# Patient Record
Sex: Female | Born: 1960 | Race: White | Hispanic: No | Marital: Married | Smoking: Never smoker
Health system: Southern US, Community
[De-identification: ages and names within clinical notes are randomized; demographics above are authoritative.]

## PROBLEM LIST (undated history)

## (undated) DIAGNOSIS — N926 Irregular menstruation, unspecified: Secondary | ICD-10-CM

## (undated) DIAGNOSIS — B379 Candidiasis, unspecified: Secondary | ICD-10-CM

## (undated) DIAGNOSIS — K219 Gastro-esophageal reflux disease without esophagitis: Secondary | ICD-10-CM

## (undated) DIAGNOSIS — Z87898 Personal history of other specified conditions: Secondary | ICD-10-CM

## (undated) DIAGNOSIS — N281 Cyst of kidney, acquired: Secondary | ICD-10-CM

## (undated) DIAGNOSIS — Z8619 Personal history of other infectious and parasitic diseases: Secondary | ICD-10-CM

## (undated) DIAGNOSIS — Z87440 Personal history of urinary (tract) infections: Secondary | ICD-10-CM

## (undated) DIAGNOSIS — Z8742 Personal history of other diseases of the female genital tract: Secondary | ICD-10-CM

## (undated) DIAGNOSIS — E282 Polycystic ovarian syndrome: Secondary | ICD-10-CM

## (undated) DIAGNOSIS — O09529 Supervision of elderly multigravida, unspecified trimester: Secondary | ICD-10-CM

## (undated) DIAGNOSIS — M1990 Unspecified osteoarthritis, unspecified site: Secondary | ICD-10-CM

## (undated) DIAGNOSIS — F439 Reaction to severe stress, unspecified: Secondary | ICD-10-CM

## (undated) DIAGNOSIS — F4321 Adjustment disorder with depressed mood: Secondary | ICD-10-CM

## (undated) DIAGNOSIS — N970 Female infertility associated with anovulation: Secondary | ICD-10-CM

## (undated) HISTORY — PX: BLADDER SURGERY: SHX569

## (undated) HISTORY — DX: Personal history of other specified conditions: Z87.898

## (undated) HISTORY — DX: Polycystic ovarian syndrome: E28.2

## (undated) HISTORY — DX: Personal history of other infectious and parasitic diseases: Z86.19

## (undated) HISTORY — DX: Adjustment disorder with depressed mood: F43.21

## (undated) HISTORY — PX: UMBILICAL HERNIA REPAIR: SHX196

## (undated) HISTORY — DX: Reaction to severe stress, unspecified: F43.9

## (undated) HISTORY — DX: Candidiasis, unspecified: B37.9

## (undated) HISTORY — PX: KIDNEY STONE SURGERY: SHX686

## (undated) HISTORY — DX: Unspecified osteoarthritis, unspecified site: M19.90

## (undated) HISTORY — DX: Irregular menstruation, unspecified: N92.6

## (undated) HISTORY — DX: Cyst of kidney, acquired: N28.1

## (undated) HISTORY — DX: Supervision of elderly multigravida, unspecified trimester: O09.529

## (undated) HISTORY — PX: KNEE SURGERY: SHX244

## (undated) HISTORY — DX: Personal history of urinary (tract) infections: Z87.440

## (undated) HISTORY — DX: Personal history of other diseases of the female genital tract: Z87.42

## (undated) HISTORY — PX: ADENOIDECTOMY: SUR15

## (undated) HISTORY — PX: OTHER SURGICAL HISTORY: SHX169

## (undated) HISTORY — DX: Gastro-esophageal reflux disease without esophagitis: K21.9

## (undated) HISTORY — DX: Female infertility associated with anovulation: N97.0

## (undated) HISTORY — PX: TONSILLECTOMY: SUR1361

## (undated) HISTORY — PX: DILATION AND CURETTAGE OF UTERUS: SHX78

---

## 1997-03-24 ENCOUNTER — Inpatient Hospital Stay (HOSPITAL_COMMUNITY): Admission: AD | Admit: 1997-03-24 | Discharge: 1997-03-24 | Payer: Self-pay | Admitting: *Deleted

## 1997-08-03 ENCOUNTER — Ambulatory Visit (HOSPITAL_COMMUNITY): Admission: RE | Admit: 1997-08-03 | Discharge: 1997-08-03 | Payer: Self-pay | Admitting: Obstetrics and Gynecology

## 1997-08-08 ENCOUNTER — Encounter: Admission: RE | Admit: 1997-08-08 | Discharge: 1997-11-06 | Payer: Self-pay | Admitting: Obstetrics and Gynecology

## 1997-10-24 ENCOUNTER — Inpatient Hospital Stay (HOSPITAL_COMMUNITY): Admission: AD | Admit: 1997-10-24 | Discharge: 1997-10-26 | Payer: Self-pay | Admitting: Obstetrics and Gynecology

## 1997-12-11 ENCOUNTER — Other Ambulatory Visit: Admission: RE | Admit: 1997-12-11 | Discharge: 1997-12-11 | Payer: Self-pay | Admitting: Obstetrics and Gynecology

## 2000-03-02 ENCOUNTER — Other Ambulatory Visit: Admission: RE | Admit: 2000-03-02 | Discharge: 2000-03-02 | Payer: Self-pay | Admitting: Obstetrics and Gynecology

## 2000-06-02 ENCOUNTER — Ambulatory Visit (HOSPITAL_COMMUNITY): Admission: RE | Admit: 2000-06-02 | Discharge: 2000-06-02 | Payer: Self-pay | Admitting: Obstetrics and Gynecology

## 2000-06-02 ENCOUNTER — Encounter: Admission: RE | Admit: 2000-06-02 | Discharge: 2000-06-02 | Payer: Self-pay | Admitting: Obstetrics and Gynecology

## 2000-06-02 ENCOUNTER — Encounter: Payer: Self-pay | Admitting: Obstetrics and Gynecology

## 2000-10-27 ENCOUNTER — Encounter: Payer: Self-pay | Admitting: Urology

## 2000-10-27 ENCOUNTER — Encounter: Admission: RE | Admit: 2000-10-27 | Discharge: 2000-10-27 | Payer: Self-pay | Admitting: Urology

## 2000-11-05 ENCOUNTER — Ambulatory Visit (HOSPITAL_COMMUNITY): Admission: RE | Admit: 2000-11-05 | Discharge: 2000-11-05 | Payer: Self-pay | Admitting: Urology

## 2000-11-05 ENCOUNTER — Encounter: Payer: Self-pay | Admitting: Urology

## 2000-11-19 ENCOUNTER — Encounter: Admission: RE | Admit: 2000-11-19 | Discharge: 2000-11-19 | Payer: Self-pay | Admitting: Urology

## 2000-11-19 ENCOUNTER — Encounter: Payer: Self-pay | Admitting: Urology

## 2001-05-19 ENCOUNTER — Other Ambulatory Visit: Admission: RE | Admit: 2001-05-19 | Discharge: 2001-05-19 | Payer: Self-pay | Admitting: Obstetrics and Gynecology

## 2001-07-13 ENCOUNTER — Encounter: Payer: Self-pay | Admitting: Obstetrics and Gynecology

## 2001-07-13 ENCOUNTER — Encounter: Admission: RE | Admit: 2001-07-13 | Discharge: 2001-07-13 | Payer: Self-pay | Admitting: Obstetrics and Gynecology

## 2002-06-15 ENCOUNTER — Other Ambulatory Visit: Admission: RE | Admit: 2002-06-15 | Discharge: 2002-06-15 | Payer: Self-pay | Admitting: Obstetrics and Gynecology

## 2002-12-26 ENCOUNTER — Encounter: Admission: RE | Admit: 2002-12-26 | Discharge: 2002-12-26 | Payer: Self-pay | Admitting: Obstetrics and Gynecology

## 2003-12-11 ENCOUNTER — Other Ambulatory Visit: Admission: RE | Admit: 2003-12-11 | Discharge: 2003-12-11 | Payer: Self-pay | Admitting: Obstetrics and Gynecology

## 2004-01-03 ENCOUNTER — Encounter: Admission: RE | Admit: 2004-01-03 | Discharge: 2004-01-03 | Payer: Self-pay | Admitting: Obstetrics and Gynecology

## 2004-02-19 ENCOUNTER — Ambulatory Visit (HOSPITAL_COMMUNITY): Admission: RE | Admit: 2004-02-19 | Discharge: 2004-02-19 | Payer: Self-pay | Admitting: Gastroenterology

## 2004-10-08 ENCOUNTER — Inpatient Hospital Stay (HOSPITAL_COMMUNITY): Admission: AD | Admit: 2004-10-08 | Discharge: 2004-10-09 | Payer: Self-pay | Admitting: Obstetrics and Gynecology

## 2004-10-09 ENCOUNTER — Encounter (INDEPENDENT_AMBULATORY_CARE_PROVIDER_SITE_OTHER): Payer: Self-pay | Admitting: Specialist

## 2004-10-09 ENCOUNTER — Encounter (INDEPENDENT_AMBULATORY_CARE_PROVIDER_SITE_OTHER): Payer: Self-pay | Admitting: *Deleted

## 2004-11-20 ENCOUNTER — Other Ambulatory Visit: Admission: RE | Admit: 2004-11-20 | Discharge: 2004-11-20 | Payer: Self-pay | Admitting: Obstetrics and Gynecology

## 2004-11-26 ENCOUNTER — Ambulatory Visit: Payer: Self-pay | Admitting: Hematology and Oncology

## 2005-01-06 DIAGNOSIS — Z87898 Personal history of other specified conditions: Secondary | ICD-10-CM

## 2005-01-06 DIAGNOSIS — F4320 Adjustment disorder, unspecified: Secondary | ICD-10-CM

## 2005-01-06 DIAGNOSIS — F439 Reaction to severe stress, unspecified: Secondary | ICD-10-CM

## 2005-01-06 DIAGNOSIS — N926 Irregular menstruation, unspecified: Secondary | ICD-10-CM

## 2005-01-06 DIAGNOSIS — F4321 Adjustment disorder with depressed mood: Secondary | ICD-10-CM

## 2005-01-06 DIAGNOSIS — N970 Female infertility associated with anovulation: Secondary | ICD-10-CM

## 2005-01-06 HISTORY — DX: Personal history of other specified conditions: Z87.898

## 2005-01-06 HISTORY — PX: OTHER SURGICAL HISTORY: SHX169

## 2005-01-06 HISTORY — DX: Irregular menstruation, unspecified: N92.6

## 2005-01-06 HISTORY — DX: Female infertility associated with anovulation: N97.0

## 2005-01-06 HISTORY — DX: Reaction to severe stress, unspecified: F43.9

## 2005-01-06 HISTORY — DX: Adjustment disorder, unspecified: F43.20

## 2005-01-06 HISTORY — DX: Adjustment disorder with depressed mood: F43.21

## 2005-03-10 ENCOUNTER — Encounter: Admission: RE | Admit: 2005-03-10 | Discharge: 2005-03-10 | Payer: Self-pay | Admitting: Obstetrics and Gynecology

## 2006-01-06 DIAGNOSIS — Z87440 Personal history of urinary (tract) infections: Secondary | ICD-10-CM

## 2006-01-06 HISTORY — DX: Personal history of urinary (tract) infections: Z87.440

## 2006-06-26 ENCOUNTER — Ambulatory Visit: Payer: Self-pay | Admitting: Pulmonary Disease

## 2006-06-30 ENCOUNTER — Encounter: Admission: RE | Admit: 2006-06-30 | Discharge: 2006-06-30 | Payer: Self-pay | Admitting: Obstetrics and Gynecology

## 2006-07-03 ENCOUNTER — Inpatient Hospital Stay (HOSPITAL_COMMUNITY): Admission: AD | Admit: 2006-07-03 | Discharge: 2006-07-03 | Payer: Self-pay | Admitting: Obstetrics and Gynecology

## 2006-07-06 ENCOUNTER — Ambulatory Visit: Payer: Self-pay | Admitting: Pulmonary Disease

## 2006-07-06 ENCOUNTER — Ambulatory Visit (HOSPITAL_BASED_OUTPATIENT_CLINIC_OR_DEPARTMENT_OTHER): Admission: RE | Admit: 2006-07-06 | Discharge: 2006-07-06 | Payer: Self-pay | Admitting: Pulmonary Disease

## 2006-09-08 ENCOUNTER — Inpatient Hospital Stay (HOSPITAL_COMMUNITY): Admission: AD | Admit: 2006-09-08 | Discharge: 2006-09-08 | Payer: Self-pay | Admitting: Obstetrics and Gynecology

## 2006-10-13 ENCOUNTER — Inpatient Hospital Stay (HOSPITAL_COMMUNITY): Admission: RE | Admit: 2006-10-13 | Discharge: 2006-10-13 | Payer: Self-pay | Admitting: Obstetrics and Gynecology

## 2006-10-26 ENCOUNTER — Inpatient Hospital Stay (HOSPITAL_COMMUNITY): Admission: AD | Admit: 2006-10-26 | Discharge: 2006-10-28 | Payer: Self-pay | Admitting: Obstetrics and Gynecology

## 2008-01-07 HISTORY — PX: OTHER SURGICAL HISTORY: SHX169

## 2008-10-02 ENCOUNTER — Encounter (INDEPENDENT_AMBULATORY_CARE_PROVIDER_SITE_OTHER): Payer: Self-pay | Admitting: Obstetrics and Gynecology

## 2008-10-02 ENCOUNTER — Ambulatory Visit (HOSPITAL_COMMUNITY): Admission: RE | Admit: 2008-10-02 | Discharge: 2008-10-02 | Payer: Self-pay | Admitting: Obstetrics and Gynecology

## 2008-10-02 DIAGNOSIS — Z8742 Personal history of other diseases of the female genital tract: Secondary | ICD-10-CM

## 2008-10-02 HISTORY — PX: HYSTEROSCOPY WITH D & C: SHX1775

## 2008-10-02 HISTORY — DX: Personal history of other diseases of the female genital tract: Z87.42

## 2009-01-06 DIAGNOSIS — Z87898 Personal history of other specified conditions: Secondary | ICD-10-CM

## 2009-01-06 HISTORY — DX: Personal history of other specified conditions: Z87.898

## 2010-04-12 LAB — BASIC METABOLIC PANEL
BUN: 13 mg/dL (ref 6–23)
CO2: 25 mEq/L (ref 19–32)
Calcium: 9 mg/dL (ref 8.4–10.5)
Chloride: 107 mEq/L (ref 96–112)
Creatinine, Ser: 0.58 mg/dL (ref 0.4–1.2)
GFR calc Af Amer: >60 mL/min (ref >60)
GFR calc non Af Amer: >60 mL/min (ref >60)
Glucose, Bld: 91 mg/dL (ref 70–99)
Potassium: 3.6 mEq/L (ref 3.5–5.1)
Sodium: 139 mEq/L (ref 135–145)

## 2010-04-12 LAB — GLUCOSE, CAPILLARY: Glucose-Capillary: 91 mg/dL (ref 70–99)

## 2010-04-12 LAB — CBC: Hemoglobin: 11.5 g/dL — ABNORMAL LOW (ref 12.0–15.0)

## 2010-04-12 LAB — PREGNANCY, URINE: Preg Test, Ur: NEGATIVE

## 2010-05-21 ENCOUNTER — Other Ambulatory Visit: Payer: Self-pay | Admitting: Obstetrics and Gynecology

## 2010-05-21 DIAGNOSIS — Z1231 Encounter for screening mammogram for malignant neoplasm of breast: Secondary | ICD-10-CM

## 2010-05-21 NOTE — Op Note (Signed)
NAME:  Laurie Garcia, Laurie Garcia                   ACCOUNT NO.:  [id number]   MEDICAL RECORD NO.:  [id number]          PATIENT TYPE:  MAT   LOCATION:  MATC                          FAC[facility]:  [facility]   PHYSICIAN:  Janine Limbo, M.D.DATE OF BIRTH:  [birth year]   DATE OF PROCEDURE:  10/13/2006  DATE OF DISCHARGE:                               OPERATIVE REPORT   PREOPERATIVE DIAGNOSIS:  1. 37 weeks' gestation.  2. Gestational diabetes requiring insulin.  3. History of a second trimester intrauterine fetal demise.   POSTOPERATIVE DIAGNOSIS:  1. 37 weeks' gestation.  2. Gestational diabetes requiring insulin.  3. History of a second trimester intrauterine fetal demise.   PROCEDURE:  Amniocentesis for maturity studies.   SURGEON:  Janine Limbo, M.D.   FIRST ASSISTANT:  None.   ANESTHESIA:  Local Xylocaine.   DISPOSITION:  Laurie Garcia is a 50 year old female, gravida 5, para 3-1-0-3,  who presents at [redacted] weeks gestation (EDC is November 04, 2006).  The  patient has been followed at the [facility]N division of  [facility].  The patient understands the indications  for her amniocentesis and potential induction.  She accepts the risks  of, but not limited to, anesthetic complications, bleeding, infection,  possible rupture of membranes, possible fetal stress, and possible  puncture of the infant.   FINDINGS:  The patient's blood type is O+.  An ultrasound was performed  and a single intrauterine gestation was noted with a cephalic  presentation.  The placenta was to the right of the uterus.  Amniotic  fluid volume was normal.  A total of 18 mL of amniotic fluid was  removed.   OPERATIVE PROCEDURE:  The patient was seen in the ultrasound suite.  An  ultrasound was performed and an appropriate fluid pocket was isolated.  The patient's abdomen was prepped with multiple layers of Betadine and  then sterilely draped.  2 mL of 1% Xylocaine were injected into the  skin.  A 22 gauge spinal needle was placed in the amniotic cavity, but  no fluid could be obtained.  We then used a 20 gauge spinal needle under  direct ultrasound guidance and 18 mL of clear fluid were removed.  The  patient tolerated the procedure well.  There was a stable heart  rate prior to the procedure and a stable heart rate after the procedure.  The patient was sent to the maternity admissions area for nonstress  test.  The fluid was sent to [facility] for testing for  maturity studies.      Janine Limbo, M.D.  Electronically Signed     AVS/MEDQ  D:  10/13/2006  T:  10/13/2006  Job:  [id number]

## 2010-05-21 NOTE — Assessment & Plan Note (Signed)
[facility]                             PULMONARY OFFICE NOTE   NAME:Laurie Garcia, Laurie Garcia                          MRN:          [id number]  DATE:06/26/2006                            DOB:          25-May-1960    I met Laurie Garcia today for evaluation of her sleep difficulties.   She is currently [redacted] weeks pregnant.  She actually had been pregnant last  year, and she had gotten to about 23 weeks in that pregnancy, when she  actually had still birth in October of 2007.  She says that she has  always had difficulties sleeping.  Usually, she will have difficulty  falling asleep initially, and then she will wake up about every 2-3  hours to have to go to the bathroom, after which sometimes she will have  difficulty falling back to sleep.  Although it appears she does get  fairly sleepy during the day, she apparently will fall asleep fairly  easily if she is sitting idle, such as when she is watching TV.  Current  sleep patterns is that she goes to bed between 11 and midnight.  It can  take her an hour to fall asleep.  She will then wake up after about 2-3  hours to have to use the bathroom, and this cycle continues through the  night until she gets up at about 6:30 in the morning.  When she wakes up  in the morning she still feels quite tired.  She has been told that she  snores quite loudly at night, and she has also woken up with a choking  sensation, particularly when she is on her back.  She gets a dry mouth  at night and will sometimes wake up feeling like her teeth are clenched.  She gets very vivid dreams.  There is no history of sleep walking or  sleep talking.  She denies any symptoms of sleep hallucinations, sleep  paralysis or cataplexy.  She had tried taking Benadryl previously prior  to her pregnancy to help with her sleep, and she has also been on Ambien  and Ambien CR to help with her sleep after the loss from her previous  pregnancy, although she is  not currently using these medicines.  She  does complain of an aching feeling in the back of her legs, more so in  her calves, at night.  She finds that she has to flex her legs in order  to relieve these symptoms.  She has also noticed this happening during  her previous pregnancies, particularly when she was nursing.  Her  current pregnancy also has been complicated by gestational diabetes.   PAST MEDICAL HISTORY:  1. Reflux disease.  2. Seasonal allergies.  3. Polycystic ovarian disease.  4. Renal cyst.  5. Nephrolithiasis.  6. Tonsillectomy and adenoidectomy while in college.   CURRENT MEDICATIONS:  Prenatal vitamins.   ALLERGIES:  TYLOX, CODEINE, ULTRAM, and LATEX.   SOCIAL HISTORY:  She is married.  She has 3 children.  She is a  Futures trader.  There is no history  of tobacco or alcohol use.   FAMILY HISTORY:  Significant for father has allergies and asthma and her  son who had problems with snoring.   REVIEW OF SYSTEMS:  Unremarkable except for as stated above.   PHYSICAL EXAMINATION:  She is 5 feet 5 inches tall, 231 pounds,  temperature is 98.6, blood pressure is 110/74, heart rate is 97, oxygen  saturation is 97% on room air.  HEENT:  Pupils reactive.  There is no sinus tenderness, no nasal  discharge.  She has a Mallampati II airway with decreased AP diameter of  the oropharynx.  She has a mild overbite.  There is no lymphadenopathy, no thyromegaly.  Heart was S1, S2, regular rate and rhythm.  CHEST:  There was no wheezing or rales.  ABDOMEN:  She has a gravid uterus.  EXTREMITIES:  There is no edema, cyanosis or clubbing.  NEUROLOGICAL EXAM:  No focal deficits were appreciated.   IMPRESSION:  The main concern that I have is if she has some degree of  sleep disorder breathing.  Given her symptom description, I would be  concerned that she maybe even had this prior to her pregnancy.  With the  pregnancy if she has sleep disorder breathing this could certainly get   worse.  What I will do is arrange for her to have an overnight  polysomnogram to further evaluate this.  I will then follow up with her  in the office to discuss the results of this with her, although she has  requested that if, in fact, she has sleep apnea, she would like to  proceed with therapy for this fairly expeditiously, and, therefore, if  need be, I may initiate her on CPAP therapy prior to being able to  follow up with her in the office.     Coralyn Helling, MD  Electronically Signed    VS/MedQ  DD: 06/26/2006  DT: 06/26/2006  Job #: [id number]   cc:   Janine Limbo, M.D.

## 2010-05-21 NOTE — H&P (Signed)
NAME:  Laurie Garcia, Laurie Garcia                   ACCOUNT NO.:  [id number]   MEDICAL RECORD NO.:  [id number]          PATIENT TYPE:  OUT   LOCATION:  ULT                           [facility]   PHYSICIAN:  Janine Limbo, M.D.DATE OF BIRTH:  05-19-1960   DATE OF ADMISSION:  DATE OF DISCHARGE:                              HISTORY & PHYSICAL   PRIORITY ADMISSION HISTORY AND PHYSICAL   DATE OF ADMISSION:  October 14, 2006.   HISTORY OF PRESENT ILLNESS:  Ms. Hunkele is a 50 year old female, gravida  5, para 3-1-0-3, who presents at [redacted] weeks gestation [facility] November 04, 2006).  The patient has been followed at the [facility]  and Gynecology Division of [facility].  This  pregnancy has been complicated by gestational diabetes that has required  insulin.  The patient is currently taking 28 units of NPH insulin at  bedtime, and 12 units of Regular insulin at dinner.  In addition, the  pregnancy has been complicated by the fact that the patient has had a  second trimester intrauterine fetal demise.  The patient is very anxious  about this pregnancy because of her past history.  The patient's age is  greater than 35, and an amniocentesis was declined.  The patient has a  history of large infants.  The patient has been diagnosed with sleep  apnea.   OBSTETRICAL HISTORY:  The patient had a vaginal delivery in 1987 of a 7  pound, 9 ounce female infant at term.  In 1991, the patient had a vaginal  delivery at [redacted] weeks gestation of a 9 pound, 1 ounce female infant.  In  1999, the patient had a vaginal delivery at [redacted] weeks gestation of an 8  pound, 1 ounce female infant.  In October of 2006, the patient had an  induced delivery of a 1 pound female infant at 77 and 1/[redacted] weeks gestation  with a stillbirth.   DRUG ALLERGIES:  1. LATEX.  2. CODEINE.  3. TYLOX.  4. ULTRAM.   PAST MEDICAL HISTORY:  1. Please see History of Present Illness.  2. The patient was on Metformin briefly  in the past.  3. The patient had her tonsils removed at age 74.  4. The patient had bladder surgery at age 64.  5. She has had surgery on her knees.   SOCIAL HISTORY:  The patient denies cigarette use, alcohol use, and  recreational drug use.   REVIEW OF SYSTEMS:  Normal pregnancy complaints.   FAMILY HISTORY:  Noncontributory.   PHYSICAL EXAMINATION:  VITAL SIGNS:  Weight is 232 pounds.  HEENT:  Within normal limits.  CHEST:  Clear.  HEART:  Regular rate and rhythm.  BREASTS:  Without masses.  ABDOMEN:  Gravid with a fundal height of 38 cm.  EXTREMITIES:  Grossly normal.  NEUROLOGIC:  Exam is grossly normal.   PELVIC  EXAM:  Cervix is 1 to 2 cm dilated, 50% effaced, and minus 3 in  station.   LABORATORY VALUES:  Blood type is O-positive, antibody screen negative,  VDRL  nonreactive, Rubella immune, HBSAG negative, HIV is nonreactive,  third trimester beta Strep is negative, amniocentesis for maturity  studies is pending.   ASSESSMENT:  1. Thirty-seven week gestation.  2. Gestational diabetes requiring insulin.  3. History of intrauterine fetal demise in the second trimester.  4. Extreme anxiety about this pregnancy.  5. Age greater than 35.  6. Obesity (weight 232 pounds).  7. Sleep apnea.  8. History of macrosomia with an ultrasound suggesting that this      infant is in the 90% percentile in growth.   PLAN:  The patient will undergo induction of labor.      Janine Limbo, M.D.  Electronically Signed     AVS/MEDQ  D:  10/08/2006  T:  10/08/2006  Job:  [id number]

## 2010-05-21 NOTE — Procedures (Signed)
NAME:  Laurie Garcia, Laurie Garcia                   ACCOUNT NO.:  [id number]   MEDICAL RECORD NO.:  [id number]          PATIENT TYPE:  OUT   LOCATION:  SLEEP CENTER                 FACILITY:  [facility]   PHYSICIAN:  Coralyn Helling, MD        DATE OF BIRTH:  1960/07/30   DATE OF STUDY:  07/06/2006                            NOCTURNAL POLYSOMNOGRAM   REFERRING PHYSICIAN:  Coralyn Helling, MD   FACILITY:  [facility].   INDICATION:  This is an individual who has a history of gestational  diabetes, sleep disruption and excessive daytime sleepiness.  She is  referred to the sleep lab for evaluation of obstructive sleep apnea.  Epworth score is 3.   MEDICATIONS:  1. Prenatal vitamins.  2. Insulin.   SLEEP ARCHITECTURE:  Total recording time was 355 minutes.  Total sleep  time was 95 minutes.  Sleep efficiency was 27%, which is significantly  reduced.  Sleep latency was 192 minutes, which is significantly  prolonged.  The study was notable for a decrease in the percentage of  slow wave sleep to 4% of the study, and the patient was not observed in  REM sleep.  She slept predominantly in the non-supine position.   RESPIRATORY DATA:  The average respiratory rate was 16.  The overall  apnea-hypopnea index was 7.6.  The events were exclusively obstructive  in nature.  The supine apnea-hypopnea index was zero.  The non-supine  apnea-hypopnea index was 12.  Moderate snoring was noted by the  technician.  It appeared the patient had difficulty with sleep  initiation and sleep maintenance due to respiratory events.   OXYGEN DATA:  The baseline oxygenation was 95%.  The oxygen saturation  nadir was 91%.  The patient spent the entire test with an oxygen  saturation above 91%.   CARDIAC DATA:  Rhythm strip showed normal sinus rhythm.   MOVEMENT AND PARASOMNIA:  The periodic limb movement index was 17.  The  patient had 2 restroom trips.   IMPRESSION:  This study shows evidence for mild obstructive sleep  apnea  as demonstrated by an apnea-hypopnea index of 7.6 with an oxygen  saturation nadir of 91%.  Please note that she did not have very much  supine sleep and she did not have REM sleep, and therefore the severity  of sleep apnea may be underestimated.  She did, however,  have a significantly decreased amount of sleep time, but it appeared  that her difficulty with sleep initiation and sleep maintenance may have  been related to air flow limitation.      Coralyn Helling, MD  Diplomat, American Board of Sleep Medicine  Electronically Signed     VS/MEDQ  D:  07/09/2006 09:00:46  T:  07/09/2006 15:36:26  Job:  [id number]

## 2010-05-24 NOTE — H&P (Signed)
NAME:  Laurie Garcia, Laurie Garcia                   ACCOUNT NO.:  [id number]   MEDICAL RECORD NO.:  [id number]           PATIENT TYPE:   LOCATION:                                 FACILITY:   PHYSICIAN:  Dineen Kid. Rana Snare, M.D.         DATE OF BIRTH:   DATE OF ADMISSION:  10/07/2004  DATE OF DISCHARGE:                                HISTORY & PHYSICAL   HISTORY OF PRESENT ILLNESS:  Laurie Garcia is a 50 year old G4, P3, at 32 weeks'  estimated gestational age by [facility] of February 03, 2005, and presents for  induction of labor due to fetal demise.  She presented to the office on  October 07, 2004 with decreased fetal movement, unable to auscultate fetal  heart tones.  Ultrasound confirmation showed a 23-week breech fetus with no  cardiac activity seen consistent with a fetal demise.  Fetus does have some  degree of edema.  No obvious abnormalities have been seen on ultrasound by  [place] and double 2 ultrasound performed by Dr. Sherrie George.   PAST MEDICAL HISTORY:  1.  Significant for urethral enlargement.  2.  History of renal cyst.  3.  She has a history of gestational diabetes.  4.  History of polycystic ovarian syndrome.  5.  History of kidney stones.   PAST SURGICAL HISTORY:  She has had a tonsillectomy.   MEDICATIONS:  1.  She is on metformin.  2.  Prenatal vitamins.   ALLERGIES:  1.  She has allergy to CODEINE.  2.  TYLOX.  3.  ULTRAM.   PHYSICAL EXAMINATION:  VITAL SIGNS:  Blood pressure is 112/60, weight is 220  pounds.  CERVIX:  Closed, thick, and high.   IMPRESSION/PLAN:  Intrauterine fetal demise, 29 weeks' gestational age.  Dr.  Lurene Garcia is her endocrinologist.  He follows her for polycystic ovarian  syndrome and she is currently on metformin.   PLAN:  Cytotec induction of fetal demise, planned vaginal delivery.  Plan  100 mcg q.12 h. until delivery.  Discussed the route of delivery.  Decreased  increased risk for dilation and curettage for the placenta.  The patient  seems to have a good  understanding of this.  We will also check parva,  toxoplasmosis, CMV titers, ANA, hemoglobin A1c, anti __________function test  as well as genetic evaluation of the fetus.      Dineen Kid Rana Snare, M.D.  Electronically Signed     DCL/MEDQ  D:  10/07/2004  T:  10/07/2004  Job:  [id number]

## 2010-05-24 NOTE — Assessment & Plan Note (Signed)
[facility]                             PULMONARY OFFICE NOTE   NAME:Nickless, Laurie Garcia                          MRN:          [id number]  DATE:07/31/2006                            DOB:          [birth year]    I had received the auto BiPAP download for [name] which was done from  July 10 to July 22.  She had been using the machine on 100% of the  nights.  Her average daily usage was 5 hours 15 minutes.  Her overall  apnea-hypopnea index was 1.7.  Her 90th percentile pressure settings  were 12/9.  I had discussed these results with [name].  She is doing  reasonably well on BiPAP, and I said that it has improved her sleep  quality as well as her energy level during the day.  I have encouraged  her to maintain her compliance with her BiPAP until after her delivery,  and then this can be reassessed after her delivery.     Coralyn Helling, MD  Electronically Signed    VS/MedQ  DD: 08/05/2006  DT: 08/06/2006  Job #: [id number]   cc:   Janine Limbo, M.D.

## 2010-05-24 NOTE — Op Note (Signed)
[facility]  Patient:    Laurie Garcia, Laurie Garcia Visit Number: [id number] MRN: [id number]          Service Type: DSU Location: DAY Attending Physician:  Lurene Shadow. 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  OPERATION:  Cystourethroscopy; right retrograde pyelogram; ureteroscopy with short rigid scope, long rigid scope, flexible ureteroscope; dilation of right posterior calix and extraction of stone matrix material; right double-J catheter.  SURGEON:  Sigmund I. Patsi Sears, M.D.  ANESTHESIA:  General endotracheal.  PREPARATION:  After appropriate preanesthesia, the patient is brought to the operating room and placed on the operating table in the dorsal supine position where a general endotracheal anesthesia was introduced.  She was then re-placed in the dorsal lithotomy position where the pubis was prepped with Betadine solution and draped in the usual fashion.  DESCRIPTION OF PROCEDURE:  Cystoscopy was accomplished and showed a normal-appearing bladder.  Right retrograde pyelogram showed an apparently normal ureter.  Left retrograde pyelogram also showed a normal-appearing ureter.  Following this, right ureteroscopy was accomplished and showed a normal-appearing ureter except it was somewhat dilated.  I did not see a right lower ureteral calculus.  The right renal pelvis was identified, and the posterior calix showed an obstruction secondary to stone.  The calix was dilated, and basket placed, and only matrix material removed.  Repeat ureteroscopy was accomplished with both the rigid ureteroscope and the flexible ureteroscope.  No further stone material was identified.  Some bleeding was  noted from the calix.  Double-J catheter was placed through a separate guidewire in excellent position.  A "string" was left on the double-J.  The patient was given IV Toradol, B&O suppository, Xylocaine within the ureter, awakened, and taken to the recovery room in excellent condition. Attending Physician:  Laqueta Jean DD:  11/05/00 TD:  11/06/00 Job: [id number]

## 2010-05-24 NOTE — Op Note (Signed)
NAME:  Laurie Garcia, Laurie Garcia                   ACCOUNT NO.:  [id number]   MEDICAL RECORD NO.:  [id number]          PATIENT TYPE:  INP   LOCATION:  9175                          [facility]ACILITY:  [facility]   PHYSICIAN:  Duke Salvia. Marcelle Overlie, M.D.DATE OF BIRTH:  06/26/1960   DATE OF PROCEDURE:  10/09/2004  DATE OF DISCHARGE:                                 OPERATIVE REPORT   PREOPERATIVE DIAGNOSIS:  Retained placenta the bleeding.   POSTOPERATIVE DIAGNOSIS:  Retained placenta the bleeding.   PROCEDURE:  D&C with removal of placental fragments.   ANESTHESIA:  General mask.   COMPLICATIONS:  None.   ESTIMATED BLOOD LOSS:  Including old clot 500 mL.   DESCRIPTION OF PROCEDURE:  The patient had a 23-week IUFD and after delivery  passed a large segment of placenta but by palpation I could tell that there  was remaining placenta. Due to brisk bleeding and patient discomfort, I was  unable to remove in the room. She was taken to the operating room stable  after general mask anesthesia and prepping. With the help of assistance a  weighted speculum plus anterior and sidewall retractors used to identify the  cervix. Prior to [facility] I was more successful with manual exploration removing  a large piece of placenta intact. After this was accomplished, the speculum  was repositioned. Cervical lip was grasped with a ring forceps and a large  banjo curette was then used to perform sharp curettage revealing minimal  tissue. Re-explored her uterus digitally since she was very comfortable at  that point and no further placental fragments could be palpated after sharp  curettage. She was given Pitocin IV throughout the case and Methergine 0.2  IM x1 along with Ancef 1 gram IV and went to recovery room in good  condition.      Richard M. Marcelle Overlie, M.D.  Electronically Signed     RMH/MEDQ  D:  10/09/2004  T:  10/09/2004  Job:  [id number]

## 2010-05-24 NOTE — Op Note (Signed)
NAME:  Laurie Garcia, Laurie Garcia                   ACCOUNT NO.:  [id number]   MEDICAL RECORD NO.:  [id number]          PATIENT TYPE:  AMB   LOCATION:  ENDO                         FACILITY:  [facility]   PHYSICIAN:  Bernette Redbird, M.D.   DATE OF BIRTH:  10-01-1960   DATE OF PROCEDURE:  02/19/2004  DATE OF DISCHARGE:                                 OPERATIVE REPORT   PROCEDURE:  Upper endoscopy.   INDICATION:  Severe reflux symptomatology, not adequately controlled on  Protonix, but responding to Nexium.   FINDINGS:  Essentially normal exam.   PROCEDURE:  The nature, purpose and risks of procedure had been discussed  with the patient who provided written consent. Sedation was fentanyl 85 mcg  and Versed 10 mg IV without arrhythmias or desaturation. The Olympus  standard adult video endoscope was passed direct vision. The vocal cords  were very briefly seen and appeared grossly normal and I did not really get  a good look at the larynx overall. The esophagus was readily entered.   The esophageal mucosa was normal. There was no evidence of reflux  esophagitis, Barrett's esophagus, varices, infection, neoplasia or any ring,  stricture or hiatal hernia. The Z-line was located essentially right at the  level the diaphragm.   The stomach contained this moderate clear residual which was suctioned off.  The gastric mucosa was unremarkable, specifically without evidence of  gastritis, erosions, ulcers, polyps, masses or vascular ectasia. A  retroflexed view of cardia showed an intermittent gap around the scope  consistent with a possible intermittent small hiatal hernia. The pylorus,  duodenal bulb and second duodenum looked normal.   The scope was removed from the patient who tolerated the procedure well and  without any apparent complications.   IMPRESSION:  Gastroesophageal reflux disease, without endoscopically-evident  sequelae (530.81).   PLAN:  Continue Nexium. Continue to emphasize lifestyle  measures.      RB/MEDQ  D:  02/19/2004  T:  02/19/2004  Job:  [id number]   cc:   Keturah Barre, M.D.  [place], [place]

## 2010-05-29 ENCOUNTER — Ambulatory Visit
Admission: RE | Admit: 2010-05-29 | Discharge: 2010-05-29 | Disposition: A | Discharge status: Home / Self Care | Payer: BC Managed Care – PPO | Source: Ambulatory Visit | Attending: Obstetrics and Gynecology | Admitting: Obstetrics and Gynecology

## 2010-05-29 DIAGNOSIS — Z1231 Encounter for screening mammogram for malignant neoplasm of breast: Secondary | ICD-10-CM

## 2010-07-03 ENCOUNTER — Other Ambulatory Visit: Payer: Self-pay | Admitting: Urology

## 2010-07-03 DIAGNOSIS — N200 Calculus of kidney: Secondary | ICD-10-CM

## 2010-07-15 ENCOUNTER — Other Ambulatory Visit (HOSPITAL_COMMUNITY): Payer: BC Managed Care – PPO

## 2010-07-16 ENCOUNTER — Other Ambulatory Visit: Payer: Self-pay | Admitting: Urology

## 2010-07-16 ENCOUNTER — Encounter (HOSPITAL_COMMUNITY): Payer: BC Managed Care – PPO

## 2010-07-16 ENCOUNTER — Other Ambulatory Visit: Payer: Self-pay | Admitting: Anesthesiology

## 2010-07-16 ENCOUNTER — Ambulatory Visit (HOSPITAL_COMMUNITY)
Admission: RE | Admit: 2010-07-16 | Discharge: 2010-07-16 | Disposition: A | Discharge status: Home / Self Care | Payer: BC Managed Care – PPO | Source: Ambulatory Visit | Attending: Urology | Admitting: Urology

## 2010-07-16 DIAGNOSIS — Z01812 Encounter for preprocedural laboratory examination: Secondary | ICD-10-CM | POA: Insufficient documentation

## 2010-07-16 DIAGNOSIS — N200 Calculus of kidney: Secondary | ICD-10-CM | POA: Insufficient documentation

## 2010-07-16 DIAGNOSIS — Z01811 Encounter for preprocedural respiratory examination: Secondary | ICD-10-CM | POA: Insufficient documentation

## 2010-07-16 DIAGNOSIS — Z01810 Encounter for preprocedural cardiovascular examination: Secondary | ICD-10-CM | POA: Insufficient documentation

## 2010-07-16 LAB — BASIC METABOLIC PANEL
Calcium: 9.7 mg/dL (ref 8.4–10.5)
Creatinine, Ser: <0.47 mg/dL — ABNORMAL LOW (ref 0.50–1.10)
Glucose, Bld: 107 mg/dL — ABNORMAL HIGH (ref 70–99)
Sodium: 138 mEq/L (ref 135–145)

## 2010-07-16 LAB — SURGICAL PCR SCREEN: MRSA, PCR: NEGATIVE

## 2010-07-16 LAB — CBC
HCT: 38.1 % (ref 36.0–46.0)
Hemoglobin: 12.2 g/dL (ref 12.0–15.0)
MCH: 27.4 pg (ref 26.0–34.0)
RBC: 4.46 MIL/uL (ref 3.87–5.11)
RDW: 14.3 % (ref 11.5–15.5)

## 2010-07-24 ENCOUNTER — Inpatient Hospital Stay (HOSPITAL_COMMUNITY): Payer: BC Managed Care – PPO

## 2010-07-24 ENCOUNTER — Inpatient Hospital Stay (HOSPITAL_COMMUNITY)
Admission: RE | Admit: 2010-07-24 | Discharge: 2010-07-24 | Disposition: A | Discharge status: Home / Self Care | Payer: BC Managed Care – PPO | Source: Ambulatory Visit | Attending: Urology | Admitting: Urology

## 2010-07-24 ENCOUNTER — Ambulatory Visit (HOSPITAL_COMMUNITY)
Admission: RE | Admit: 2010-07-24 | Discharge: 2010-07-25 | Disposition: A | Discharge status: Home / Self Care | Payer: BC Managed Care – PPO | Source: Ambulatory Visit | Attending: Urology | Admitting: Urology

## 2010-07-24 DIAGNOSIS — Z91040 Latex allergy status: Secondary | ICD-10-CM | POA: Insufficient documentation

## 2010-07-24 DIAGNOSIS — N200 Calculus of kidney: Principal | ICD-10-CM | POA: Insufficient documentation

## 2010-07-24 DIAGNOSIS — Z01810 Encounter for preprocedural cardiovascular examination: Secondary | ICD-10-CM | POA: Insufficient documentation

## 2010-07-24 DIAGNOSIS — G4733 Obstructive sleep apnea (adult) (pediatric): Secondary | ICD-10-CM | POA: Insufficient documentation

## 2010-07-24 DIAGNOSIS — Z01818 Encounter for other preprocedural examination: Secondary | ICD-10-CM | POA: Insufficient documentation

## 2010-07-24 DIAGNOSIS — Z01812 Encounter for preprocedural laboratory examination: Secondary | ICD-10-CM | POA: Insufficient documentation

## 2010-07-24 DIAGNOSIS — E119 Type 2 diabetes mellitus without complications: Secondary | ICD-10-CM | POA: Insufficient documentation

## 2010-07-24 LAB — GLUCOSE, CAPILLARY
Glucose-Capillary: 140 mg/dL — ABNORMAL HIGH (ref 70–99)
Glucose-Capillary: 159 mg/dL — ABNORMAL HIGH (ref 70–99)

## 2010-07-24 LAB — ABO/RH: ABO/RH(D): O POS

## 2010-07-24 LAB — TYPE AND SCREEN: Antibody Screen: NEGATIVE

## 2010-07-24 MED ORDER — IOHEXOL 300 MG/ML  SOLN
50.0000 mL | Freq: Once | INTRAMUSCULAR | Status: AC | PRN
  Administered 2010-07-24: 20 mL

## 2010-07-25 LAB — GLUCOSE, CAPILLARY: Glucose-Capillary: 112 mg/dL — ABNORMAL HIGH (ref 70–99)

## 2010-07-25 LAB — CBC
MCV: 85.4 fL (ref 78.0–100.0)
Platelets: 243 10*3/uL (ref 150–400)
RDW: 14.2 % (ref 11.5–15.5)
WBC: 8.3 10*3/uL (ref 4.0–10.5)

## 2010-08-06 NOTE — Discharge Summary (Signed)
  NAME:  Laurie Garcia, Laurie Garcia                   ACCOUNT NO.:  [id number]  MEDICAL RECORD NO.:  [id number]  LOCATION:  1444                         FACILITY:  [facility]  PHYSICIAN:  Bertram Millard. Jamee Keach, M.D.DATE OF BIRTH:  10-17-1960  DATE OF ADMISSION:  07/24/2010 DATE OF DISCHARGE:  07/25/2010                              DISCHARGE SUMMARY   PRIMARY DIAGNOSIS:  Right renal calculi.  PRINCIPAL PROCEDURE:  Percutaneous right nephrolithotomy.  OTHER PROCEDURES:  Right percutaneous nephrostomy tube placement.  OTHER DIAGNOSES: 1. Diabetes mellitus. 2. Arthritis. 3. GERD. 4. History of right renal cyst.  BRIEF HISTORY:  This 50 year old female was admitted for percutaneous nephrolithotomy of 15-mm right renal stone and 2 smaller upper pole renal calculi.  She has chosen to have this procedure done rather than having lithotripsy.  PHYSICAL EXAMINATION:  For physical examination, please see the patient's history and physical.  ADMISSION DATA:  EKG revealed normal sinus rhythm.  Chest x-ray revealed no active cardiopulmonary disease.  PREOPERATIVE LABORATORIES:  Included a CBC which was normal.  Pregnancy test was negative.  BMET was normal except for glucose of 107.  HOSPITAL COURSE:  The patient was admitted to the hospital, and taken to Interventional Radiology where Dr. Irish Lack performed percutaneous nephrostomy tube placement.  She was then taken to the operating room where right percutaneous nephrolithotomy was performed.  She tolerated the procedure well.  On postoperative day #1, her hematocrit was 32%.  She had normal blood pressure eventually, and was eating a regular diet.  Her nephrostomy tube was clamped early in the morning of the 19th, postoperative day #1.  She did not have any significant pain after this, she had no residual urine when the catheter was drained and was eventually removed.  Dressing was placed.  She was discharged at this time.  She was discharged on  a regular home medications as well as Cipro 250 mg p.o. b.i.d. for 3 days and hydromorphone 2 mg p.o. q. 4 h p.r.n. pain.  She will follow up with me on August 02, 2010.  Discharge instructions were discussed with the patient and her husband.     Bertram Millard. Eternity Dexter, M.D.     SMD/MEDQ  D:  07/25/2010  T:  07/25/2010  Job:  [id number]  Electronically Signed by Marcine Matar M.D. on 08/06/2010 04:56:45 PM

## 2010-08-06 NOTE — Op Note (Signed)
NAME:  Laurie Garcia, Laurie Garcia                   ACCOUNT NO.:  [id number]  MEDICAL RECORD NO.:  [id number]  LOCATION:  0010                         FACILITY:  [facility]  PHYSICIAN:  Bertram Millard. Kyriakos Babler, M.D.DATE OF BIRTH:  10/09/1960  DATE OF PROCEDURE:  07/24/2010 DATE OF DISCHARGE:                              OPERATIVE REPORT   PREOPERATIVE DIAGNOSIS:  A 15-mm right renal calculus.  POSTOPERATIVE DIAGNOSIS:  A 15-mm right renal calculus.  PRINCIPAL PROCEDURE:  Right percutaneous nephrolithotomy.  SURGEON:  Bertram Millard. [name], M.D.  ANESTHESIA:  General endotracheal.  COMPLICATIONS:  None.  SPECIMENS:  Stone fragments to family.  DRAINS:  A 16-French Foley catheter, 18-French percutaneous tube in the right flank.  BRIEF HISTORY:  The patient is a 50 year old female presenting for definitive management of a 15-mm symptomatic right renal stone.  This has been known about for some time.  She has intermittent flank pain, gross hematuria with this.  Additionally, she has 2 small stones superior to this.  The patient has chosen to have percutaneous nephrolithotomy, having been informed of the options of percutaneous nephrolithotomy and lithotripsy.  She is aware of risks and complications and desires to proceed.  DESCRIPTION OF PROCEDURE:  The patient was identified in the holding area.  She previously had a right percutaneous access placed by Dr. Fredia Garcia in Interventional Radiology.  Once the surgical site was marked, she was taken to the operating room where general endotracheal anesthetic was established.  She was then placed in prone position after bladder was catheterized with Foley catheter.  All extremities were padded appropriately, as well as pressure points.  The right flank was prepped and draped.  Time-out was then performed.  I then negotiated 2 guidewires into the bladder, first through the previously placed ureteral catheter and then through a peel-away catheter.  I  then negotiated a Trackmaster 15-mm balloon dilator over one of the guidewires and dilated the percutaneous tract.  I then placed the nephrostomy tube sheath over top of this.  I then gained access to the renal pelvis.  I then placed the nephroscope.  Small clots were picked out of the renal pelvis.  The stone was then seen and followed up into an upper pole calix.  It was treated and broken up, as well as aspirated with the Swiss lithoclast.  No further fragments were present in the upper pole calix after I completed this.  I then used a flexible cystoscope to negotiate into the renal calyces of the upper pole, where the prior stones were.  No stones were seen, despite removing all small blood clots which had been previously formed in the upper pole calyces. Since no stones were seen within the renal pelvis or any of the calyces, I then terminated the procedure.  An 18-French latex free catheter was advanced over one of the guidewires into the renal pelvis.  At this point, I performed an antegrade nephrostogram.  There were obvious filling defects within the renal pelvis from clots but there was no extravasation.  There was minimal passage of contrast down into the proximal ureter.  The remaining guidewire was removed, and the balloon filled with 3 cc of  water.  I then sutured the catheter to the skin and also put a vertical mattress suture on the skin to close it with 2-0 silk suture.  Compressive dressings were placed underneath a large Tegaderm.  The catheter was left to dependent drainage.  The patient tolerated the procedure well.  She was awakened and taken to PACU in stable condition.     Bertram Millard. Laurie Garcia, M.D.     SMD/MEDQ  D:  07/24/2010  T:  07/24/2010  Job:  [id number]  Electronically Signed by Marcine Matar M.D. on 08/06/2010 04:56:47 PM

## 2010-08-09 DIAGNOSIS — N281 Cyst of kidney, acquired: Secondary | ICD-10-CM

## 2010-08-09 HISTORY — DX: Cyst of kidney, acquired: N28.1

## 2010-10-16 LAB — RPR: RPR Ser Ql: NONREACTIVE

## 2010-10-16 LAB — CBC
HCT: 24.9 — ABNORMAL LOW
Hemoglobin: 8.7 — ABNORMAL LOW
RBC: 2.69 — ABNORMAL LOW
RBC: 4.03
WBC: 13.4 — ABNORMAL HIGH
WBC: 7

## 2010-10-16 LAB — URINE MICROSCOPIC-ADD ON

## 2010-10-16 LAB — URINALYSIS, ROUTINE W REFLEX MICROSCOPIC
Glucose, UA: NEGATIVE
Hgb urine dipstick: NEGATIVE
Specific Gravity, Urine: 1.01

## 2010-12-18 DIAGNOSIS — Z87898 Personal history of other specified conditions: Secondary | ICD-10-CM

## 2010-12-18 HISTORY — DX: Personal history of other specified conditions: Z87.898

## 2011-05-06 ENCOUNTER — Other Ambulatory Visit: Payer: Self-pay | Admitting: Obstetrics and Gynecology

## 2011-05-06 DIAGNOSIS — Z803 Family history of malignant neoplasm of breast: Secondary | ICD-10-CM

## 2011-05-06 DIAGNOSIS — Z1231 Encounter for screening mammogram for malignant neoplasm of breast: Secondary | ICD-10-CM

## 2011-05-14 ENCOUNTER — Other Ambulatory Visit: Payer: Self-pay | Admitting: Family Medicine

## 2011-05-14 DIAGNOSIS — Z780 Asymptomatic menopausal state: Secondary | ICD-10-CM

## 2011-06-10 ENCOUNTER — Ambulatory Visit: Payer: BC Managed Care – PPO

## 2011-06-13 ENCOUNTER — Other Ambulatory Visit: Payer: BC Managed Care – PPO

## 2011-06-13 ENCOUNTER — Inpatient Hospital Stay: Admission: RE | Admit: 2011-06-13 | Payer: BC Managed Care – PPO | Source: Ambulatory Visit

## 2011-06-27 ENCOUNTER — Ambulatory Visit
Admission: RE | Admit: 2011-06-27 | Discharge: 2011-06-27 | Disposition: A | Discharge status: Home / Self Care | Payer: BC Managed Care – PPO | Source: Ambulatory Visit | Attending: Obstetrics and Gynecology | Admitting: Obstetrics and Gynecology

## 2011-06-27 ENCOUNTER — Ambulatory Visit
Admission: RE | Admit: 2011-06-27 | Discharge: 2011-06-27 | Disposition: A | Discharge status: Home / Self Care | Payer: BC Managed Care – PPO | Source: Ambulatory Visit | Attending: Family Medicine | Admitting: Family Medicine

## 2011-06-27 DIAGNOSIS — Z803 Family history of malignant neoplasm of breast: Secondary | ICD-10-CM

## 2011-06-27 DIAGNOSIS — Z1231 Encounter for screening mammogram for malignant neoplasm of breast: Secondary | ICD-10-CM

## 2011-07-14 ENCOUNTER — Telehealth: Payer: Self-pay | Admitting: Obstetrics and Gynecology

## 2011-07-14 ENCOUNTER — Encounter: Payer: Self-pay | Admitting: Obstetrics and Gynecology

## 2011-07-14 NOTE — Telephone Encounter (Signed)
TC from pt.   States LMP 06/29/11.  Has been out of town.  Was bleeding using tampon q 1-1 1/2 hr.  Bleeding slowed down for a day or two.   Now changing tampon q 2 hr. and flow seems to be decreasing. Feels tired but is taking mutivit with Fe. No lightheadedness or dizziness.  Not taking any hormones.  Had been on Progesterone but D/C'd when mother was diagnosed with breast cancer.  States menses last cycle was normal.  Sched for eval with Dr SR 07/16/11 (after consult with DF).  Advised to call if bleeding >tampon or pad /hr or any change in sx.  To rest and increase fluids and avoid prolonged exposure to heat.  Advised not to [employer].   Pt verbalizes comprehension.

## 2011-07-16 ENCOUNTER — Encounter: Payer: Self-pay | Admitting: Obstetrics and Gynecology

## 2011-07-16 ENCOUNTER — Ambulatory Visit (INDEPENDENT_AMBULATORY_CARE_PROVIDER_SITE_OTHER): Payer: BC Managed Care – PPO | Admitting: Obstetrics and Gynecology

## 2011-07-16 VITALS — BP 106/68 | Wt 224.0 lb

## 2011-07-16 DIAGNOSIS — N949 Unspecified condition associated with female genital organs and menstrual cycle: Secondary | ICD-10-CM

## 2011-07-16 DIAGNOSIS — N938 Other specified abnormal uterine and vaginal bleeding: Secondary | ICD-10-CM

## 2011-07-16 MED ORDER — PROGESTERONE MICRONIZED 200 MG PO CAPS: 200.0000 mg | ORAL_CAPSULE | Freq: Every day | ORAL | Status: DC

## 2011-07-16 NOTE — Progress Notes (Signed)
  Current contraception: none. Hormone replacement therapy: No New medication: No  History of LOV:FIEP  History of infertility: yes - years ago . History of abnormal Pap smear: no  08/2010 normal History of fibroids: No  Increased stress: Yes   Abnormal bleeding pattern started: June 23th and still have been bleeding.  Subjective:     Laurie Garcia is a 51 y.o. PIRJJ,[id number], who presents for irregular menses.  Bleeding pattern: cycle monthly / 7 days to occ spotting for 5 more days. May was a normal cycle.June 23 scheduled cycle but started very heavy, 1 ultra tampon every 1 hour where tampon came out.Clots as large as a lime. Since yesterday, flow has improved.Today, 1 tampon every 2-3 hours.Feels crampy +++.  Denies any urinary tract symptoms, changes in bowel movements, nausea, vomiting or fever.   Current contraception: none.  The following portions of the patient's history were reviewed and updated as appropriate: allergies, current medications, past family history, past medical history, past social history and past surgical history.  Review of Systems Pertinent items are noted in HPI.    Objective:    BP 106/68  Wt 224 lb (101.606 kg)  LMP 06/29/2011  Weight:  Wt Readings from Last 1 Encounters:  07/16/11 224 lb (101.606 kg)    BMI: There is no height on file to calculate BMI.  General Appearance: Alert, appropriate appearance for age. No acute distress HEENT: Grossly normal Pelvic Exam: Vulva and vagina appear normal.Small amount of blood in vault with clots. Bimanual exam reveals normal adnexa.Uterus is bulky 10-12 weeks. Rectovaginal: not indicated      Assessment:   Dysfunctional uterine bleeding Menorrhagia  Perimenopausal anovulatory bleeding   Plan:   CBC Prometrium 200 mg daily for 10 days now and every calendar month starting in September  [facility] A  MD 07/16/2011 4:20 PM

## 2011-07-17 LAB — CBC
Hemoglobin: 10.2 g/dL — ABNORMAL LOW (ref 12.0–15.0)
MCH: 27.8 pg (ref 26.0–34.0)
MCV: 84.7 fL (ref 78.0–100.0)
RBC: 3.67 MIL/uL — ABNORMAL LOW (ref 3.87–5.11)

## 2011-07-17 NOTE — Progress Notes (Signed)
Quick Note:  Please call pt and instruct to use iron supplement BID  ______

## 2011-07-18 ENCOUNTER — Telehealth: Payer: Self-pay

## 2011-07-18 NOTE — Telephone Encounter (Signed)
Message copied by Larwance Rote on Fri Jul 18, 2011  9:46 AM ------      Message from: Silverio Lay      Created: Thu Jul 17, 2011  7:29 AM       Please call pt and instruct to use iron supplement BID

## 2011-07-18 NOTE — Telephone Encounter (Signed)
Pt instructed to take iron supplement  twice daily. 20 min phone call discussing her period, instructions for Prometrium and her not being able to sleep.  ld

## 2011-09-02 ENCOUNTER — Telehealth: Payer: Self-pay | Admitting: Obstetrics and Gynecology

## 2011-09-02 NOTE — Telephone Encounter (Signed)
SR pt 

## 2011-09-02 NOTE — Telephone Encounter (Signed)
Laura/return call

## 2011-09-04 NOTE — Telephone Encounter (Signed)
Notified pt that she needs to confirm AEX appt.  Pt agreeable.  ld

## 2011-09-19 ENCOUNTER — Telehealth: Payer: Self-pay | Admitting: Obstetrics and Gynecology

## 2011-09-19 NOTE — Telephone Encounter (Signed)
Routed to LD

## 2011-09-22 ENCOUNTER — Telehealth: Payer: Self-pay

## 2011-09-22 NOTE — Telephone Encounter (Signed)
LMTC @ 3:18  ld

## 2011-09-23 ENCOUNTER — Ambulatory Visit: Payer: BC Managed Care – PPO | Admitting: Obstetrics and Gynecology

## 2011-11-27 ENCOUNTER — Ambulatory Visit (INDEPENDENT_AMBULATORY_CARE_PROVIDER_SITE_OTHER): Payer: BC Managed Care – PPO | Admitting: Obstetrics and Gynecology

## 2011-11-27 ENCOUNTER — Encounter: Payer: Self-pay | Admitting: Obstetrics and Gynecology

## 2011-11-27 VITALS — BP 128/64 | Ht 65.75 in | Wt 224.0 lb

## 2011-11-27 DIAGNOSIS — N970 Female infertility associated with anovulation: Secondary | ICD-10-CM | POA: Insufficient documentation

## 2011-11-27 DIAGNOSIS — Z01419 Encounter for gynecological examination (general) (routine) without abnormal findings: Secondary | ICD-10-CM

## 2011-11-27 DIAGNOSIS — Z124 Encounter for screening for malignant neoplasm of cervix: Secondary | ICD-10-CM

## 2011-11-27 MED ORDER — PROGESTERONE MICRONIZED 200 MG PO CAPS: ORAL_CAPSULE | ORAL | Status: DC

## 2011-11-27 NOTE — Progress Notes (Signed)
The patient reports:she complains of heavier than normal bleeding this month.   Contraception:condoms  Known for anovulatory bleeding but not taking her Prometrium because 2 othe MDs told her not to.  Last mammogram: approximate date 09/05/2010 and was normal Last pap: approximate date 06/30/2011 and was normal   GC/Chlamydia cultures offered: declined HIV/RPR/HbsAg offered:  declined HSV 1 and 2 glycoprotein offered: declined  Menstrual cycle regular and monthly: Yes Menstrual flow normal: No: heavy flow  Urinary symptoms: none Normal bowel movements: Yes Reports abuse at home: No:   Subjective:    Laurie Garcia is a 51 y.o. female, [id number], who presents for an annual exam.     History   Social History  . Marital Status: Married    Spouse Name: N/A    Number of Children: N/A  . Years of Education: N/A   Social History Main Topics  . Smoking status: Never Smoker   . Smokeless tobacco: Never Used  . Alcohol Use: No  . Drug Use: No  . Sexually Active: Yes    Birth Control/ Protection: Condom   Other Topics Concern  . None   Social History Narrative  . None    Menstrual cycle:   LMP: Patient's last menstrual period was 11/20/2011.           Cycle: Pt states that last cycle starting on 11/20/2011 were heavier than normal. States that she didn't have as many clots this time as usual. No cramping or abdominal pain during cycle.   The following portions of the patient's history were reviewed and updated as appropriate: allergies, current medications, past family history, past medical history, past social history, past surgical history and problem list.  Review of Systems Pertinent items are noted in HPI. Breast:Negative for breast lump,nipple discharge or nipple retraction Gastrointestinal: Negative for abdominal pain, change in bowel habits or rectal bleeding Urinary:negative   Objective:    BP 128/64  Ht 5' 5.75" (1.67 m)  Wt 224 lb (101.606 kg)  BMI 36.43 kg/m2   LMP 11/20/2011    Weight:  Wt Readings from Last 1 Encounters:  11/27/11 224 lb (101.606 kg)          BMI: Body mass index is 36.43 kg/(m^2).  General Appearance: Alert, appropriate appearance for age. No acute distress HEENT: Grossly normal Neck / Thyroid: Supple, no masses, nodes or enlargement Lungs: clear to auscultation bilaterally Back: No CVA tenderness Breast Exam: No masses or nodes.No dimpling, nipple retraction or discharge. Cardiovascular: Regular rate and rhythm. S1, S2, no murmur Gastrointestinal: Soft, non-tender, no masses or organomegaly Pelvic Exam: Vulva and vagina appear normal. Bimanual exam reveals normal uterus and adnexa. Rectovaginal: deferred due to recent colonoscopy Lymphatic Exam: Non-palpable nodes in neck, clavicular, axillary, or inguinal regions Skin: no rash or abnormalities Neurologic: Normal gait and speech, no tremor  Psychiatric: Alert and oriented, appropriate affect.     Assessment:    Normal gyn exam    Plan:   Resume  prometrium 200 mg day 16-28 mammogram pap smear return annually or prn STD screening: declined Contraception:condoms   Silverio Lay MD

## 2011-11-28 LAB — PAP IG W/ RFLX HPV ASCU

## 2012-01-20 ENCOUNTER — Telehealth: Payer: Self-pay | Admitting: Obstetrics and Gynecology

## 2012-01-20 NOTE — Telephone Encounter (Signed)
Pt had visit here on 11/27/11 for AEX with SR, states that her last cycle was 11/20/11. Was given prometrium to take on days 16-28 @ visit. Pt states that she has not had a cycle since November when medication was started. States that before med cycles were monthly. Has taken UPT which was Negative. Pt states that she doesn't want to come in unless absolutely necessary. Pt states that she has $4,000 deductible with her ins.   Also pt was given Ambien 10 mg by EP 12/2010. States that she has been using them sparingly and breaking them in half to make them last. Wanting to know if she can also get a refill of this sent to pharmacy.  Darien Ramus, CMA

## 2012-01-21 NOTE — Telephone Encounter (Signed)
Please inform that absence of bleeding after 3 trials of Prometrium is compatible with menopause. Keep AEX

## 2012-01-22 NOTE — Telephone Encounter (Signed)
LVM for pt to return call.   Suhaib Guzzo, CMA  

## 2012-01-23 ENCOUNTER — Telehealth: Payer: Self-pay

## 2012-01-23 MED ORDER — ZOLPIDEM TARTRATE 10 MG PO TABS: 10.0000 mg | ORAL_TABLET | Freq: Every evening | ORAL | Status: DC | PRN

## 2012-01-23 NOTE — Addendum Note (Signed)
Addended by: Darien Ramus on: 01/23/2012 03:29 PM   Modules accepted: Orders

## 2012-01-23 NOTE — Telephone Encounter (Signed)
Per SR, Ambien 10 mg sig: 1 q hs prn # 20 w/ NO RF's called to pt's pharmacy in [place]. Melody Comas A

## 2012-01-23 NOTE — Telephone Encounter (Signed)
Pt aware and voiced understanding.  Darien Ramus, CMA

## 2012-03-15 ENCOUNTER — Telehealth: Payer: Self-pay | Admitting: Obstetrics and Gynecology

## 2012-03-15 NOTE — Telephone Encounter (Signed)
TC to pt. States is having vaginal burning x 2-3 weeks. Cream she has is not helping. Also feels urinary pressure. Sched for eval with LC 03/16/12.

## 2012-03-16 ENCOUNTER — Encounter: Payer: Self-pay | Admitting: Family Medicine

## 2012-03-16 ENCOUNTER — Ambulatory Visit: Payer: BC Managed Care – PPO | Admitting: Family Medicine

## 2012-03-16 VITALS — BP 112/70 | Ht 65.0 in | Wt 222.0 lb

## 2012-03-16 DIAGNOSIS — B3731 Acute candidiasis of vulva and vagina: Secondary | ICD-10-CM

## 2012-03-16 DIAGNOSIS — N8111 Cystocele, midline: Secondary | ICD-10-CM

## 2012-03-16 DIAGNOSIS — N760 Acute vaginitis: Secondary | ICD-10-CM

## 2012-03-16 DIAGNOSIS — R3915 Urgency of urination: Secondary | ICD-10-CM

## 2012-03-16 LAB — POCT URINALYSIS DIPSTICK
Bilirubin, UA: NEGATIVE
Blood, UA: NEGATIVE
Ketones, UA: NEGATIVE
Nitrite, UA: NEGATIVE
Spec Grav, UA: 1.025
Urobilinogen, UA: NEGATIVE
pH, UA: 5

## 2012-03-16 LAB — POCT WET PREP (WET MOUNT)
Clue Cells Wet Prep Whiff POC: NEGATIVE
Trichomonas Wet Prep HPF POC: NEGATIVE

## 2012-03-16 MED ORDER — FLUCONAZOLE 150 MG PO TABS: 150.0000 mg | ORAL_TABLET | Freq: Once | ORAL | Status: DC

## 2012-03-16 MED ORDER — METRONIDAZOLE 500 MG PO TABS: 500.0000 mg | ORAL_TABLET | Freq: Two times a day (BID) | ORAL | Status: AC

## 2012-03-16 NOTE — Progress Notes (Signed)
S: Complains of something protruding from vaginal x few weeks worse with lifting and straining.  Now burning and irritation x 2 weeks.  Urgency and burning.  LMP 11/21/2011 was taking Prometrium.  Right breast masses and tenderness, pea sized.  Last mammogram 2014-negative for breast cancer per patient.  Denies injuries or surgeries to breast.     O: Breast exam unremarkable.  No lymphadenopathy noted to infra/supraclavicular, or axillary areas. Hair distrubution is normal. External genitalia is appropriately colored and moist without lesions or discharge.  BSU normal size without enlargement or lesions.   Vagina is pale pink with decreased rugae and obvious cystocele and side wall protrusion.  Moderate amount of thin, watery malodorous discharge.  Parous pale-pink cervix without discharge, lesions or tenderness.  Uterus feel appropriate size, difficulty determine position due to body habitus, good mobility without masses or tenderness.  Adnexal region without masses or tenderness.  Rectal: External mucosa appropriately colored without lesions. Sphincter intact without masses.  Stool yellowish.  Guaiac defer.    A: [place]: Positive for yeast/BV.     Breast thickening, no nodules.    Cystocele    UA: + for leukocytes/glucose  P: BV: Flagyl as ordered.      Yeast: Diflucan 150 mg PO x 1 after flagyl.     Urine for culture     F/U with Dr. Estanislado Pandy for surgery consult for cystocele.   Lynnell Jude, FNP-BC

## 2012-03-17 LAB — URINE CULTURE
Colony Count: NO GROWTH
Organism ID, Bacteria: NO GROWTH

## 2012-03-19 ENCOUNTER — Telehealth: Payer: Self-pay | Admitting: Family Medicine

## 2012-03-19 ENCOUNTER — Other Ambulatory Visit: Payer: Self-pay | Admitting: Obstetrics and Gynecology

## 2012-03-19 MED ORDER — NYSTATIN-TRIAMCINOLONE 100000-0.1 UNIT/GM-% EX CREA: TOPICAL_CREAM | Freq: Four times a day (QID) | CUTANEOUS | Status: DC

## 2012-03-19 NOTE — Telephone Encounter (Signed)
TC to pt. States sx are much better but is having vulvar itching and burning in the evening x past 2 nights. Requests cream to use. Is taking Flagyl. Is delaying Diflucan until completion of Flagyl. Per [facility] Rx for Mycolog E-scribed. Pt informed.

## 2012-03-29 ENCOUNTER — Other Ambulatory Visit: Payer: Self-pay | Admitting: Obstetrics and Gynecology

## 2012-06-02 ENCOUNTER — Other Ambulatory Visit: Payer: Self-pay | Admitting: Obstetrics and Gynecology

## 2012-06-02 DIAGNOSIS — N644 Mastodynia: Secondary | ICD-10-CM

## 2012-06-09 ENCOUNTER — Ambulatory Visit
Admission: RE | Admit: 2012-06-09 | Discharge: 2012-06-09 | Disposition: A | Discharge status: Home / Self Care | Payer: BC Managed Care – PPO | Source: Ambulatory Visit | Attending: Obstetrics and Gynecology | Admitting: Obstetrics and Gynecology

## 2012-06-09 ENCOUNTER — Other Ambulatory Visit: Payer: Self-pay | Admitting: Obstetrics and Gynecology

## 2012-06-09 DIAGNOSIS — N644 Mastodynia: Secondary | ICD-10-CM

## 2012-06-23 ENCOUNTER — Other Ambulatory Visit: Payer: Self-pay | Admitting: Obstetrics and Gynecology

## 2012-06-23 DIAGNOSIS — N644 Mastodynia: Secondary | ICD-10-CM

## 2013-09-30 ENCOUNTER — Other Ambulatory Visit: Payer: Self-pay

## 2013-09-30 DIAGNOSIS — M79629 Pain in unspecified upper arm: Secondary | ICD-10-CM

## 2013-09-30 DIAGNOSIS — Z803 Family history of malignant neoplasm of breast: Secondary | ICD-10-CM

## 2013-09-30 DIAGNOSIS — R2230 Localized swelling, mass and lump, unspecified upper limb: Secondary | ICD-10-CM

## 2013-10-17 ENCOUNTER — Ambulatory Visit
Admission: RE | Admit: 2013-10-17 | Discharge: 2013-10-17 | Disposition: A | Discharge status: Home / Self Care | Payer: BC Managed Care – PPO | Source: Ambulatory Visit

## 2013-10-17 ENCOUNTER — Encounter (INDEPENDENT_AMBULATORY_CARE_PROVIDER_SITE_OTHER): Payer: Self-pay

## 2013-10-17 ENCOUNTER — Other Ambulatory Visit: Payer: Self-pay

## 2013-10-17 DIAGNOSIS — R2230 Localized swelling, mass and lump, unspecified upper limb: Secondary | ICD-10-CM

## 2013-10-17 DIAGNOSIS — Z803 Family history of malignant neoplasm of breast: Secondary | ICD-10-CM

## 2013-10-17 DIAGNOSIS — M79629 Pain in unspecified upper arm: Secondary | ICD-10-CM

## 2013-11-07 ENCOUNTER — Encounter: Payer: Self-pay | Admitting: Family Medicine

## 2014-12-22 ENCOUNTER — Other Ambulatory Visit: Payer: Self-pay

## 2014-12-22 DIAGNOSIS — Z1231 Encounter for screening mammogram for malignant neoplasm of breast: Secondary | ICD-10-CM

## 2014-12-26 ENCOUNTER — Ambulatory Visit
Admission: RE | Admit: 2014-12-26 | Discharge: 2014-12-26 | Disposition: A | Discharge status: Home / Self Care | Payer: BLUE CROSS/BLUE SHIELD | Source: Ambulatory Visit

## 2014-12-26 DIAGNOSIS — Z1231 Encounter for screening mammogram for malignant neoplasm of breast: Secondary | ICD-10-CM

## 2016-01-18 ENCOUNTER — Other Ambulatory Visit: Payer: Self-pay | Admitting: Family Medicine

## 2016-01-18 DIAGNOSIS — Z1231 Encounter for screening mammogram for malignant neoplasm of breast: Secondary | ICD-10-CM

## 2016-01-21 ENCOUNTER — Ambulatory Visit: Payer: BLUE CROSS/BLUE SHIELD

## 2016-02-18 ENCOUNTER — Ambulatory Visit
Admission: RE | Admit: 2016-02-18 | Discharge: 2016-02-18 | Disposition: A | Discharge status: Home / Self Care | Payer: BLUE CROSS/BLUE SHIELD | Source: Ambulatory Visit | Attending: Family Medicine | Admitting: Family Medicine

## 2016-02-18 DIAGNOSIS — Z1231 Encounter for screening mammogram for malignant neoplasm of breast: Secondary | ICD-10-CM

## 2017-07-21 ENCOUNTER — Other Ambulatory Visit: Payer: Self-pay | Admitting: Family Medicine

## 2017-07-21 DIAGNOSIS — Z1231 Encounter for screening mammogram for malignant neoplasm of breast: Secondary | ICD-10-CM

## 2017-08-11 ENCOUNTER — Ambulatory Visit
Admission: RE | Admit: 2017-08-11 | Discharge: 2017-08-11 | Disposition: A | Discharge status: Home / Self Care | Payer: BLUE CROSS/BLUE SHIELD | Source: Ambulatory Visit | Attending: Family Medicine | Admitting: Family Medicine

## 2017-08-11 DIAGNOSIS — Z1231 Encounter for screening mammogram for malignant neoplasm of breast: Secondary | ICD-10-CM

## 2018-09-02 ENCOUNTER — Other Ambulatory Visit: Payer: Self-pay | Admitting: Family Medicine

## 2018-09-02 DIAGNOSIS — Z1231 Encounter for screening mammogram for malignant neoplasm of breast: Secondary | ICD-10-CM

## 2018-10-14 ENCOUNTER — Other Ambulatory Visit: Payer: Self-pay

## 2018-10-14 ENCOUNTER — Ambulatory Visit
Admission: RE | Admit: 2018-10-14 | Discharge: 2018-10-14 | Disposition: A | Discharge status: Home / Self Care | Payer: BC Managed Care – PPO | Source: Ambulatory Visit | Attending: Family Medicine | Admitting: Family Medicine

## 2018-10-14 DIAGNOSIS — Z1231 Encounter for screening mammogram for malignant neoplasm of breast: Secondary | ICD-10-CM

## 2018-10-15 ENCOUNTER — Ambulatory Visit: Payer: BLUE CROSS/BLUE SHIELD

## 2019-09-13 ENCOUNTER — Other Ambulatory Visit: Payer: Self-pay | Admitting: Family Medicine

## 2019-09-13 DIAGNOSIS — Z1231 Encounter for screening mammogram for malignant neoplasm of breast: Secondary | ICD-10-CM

## 2019-10-18 ENCOUNTER — Ambulatory Visit: Payer: BC Managed Care – PPO

## 2019-12-06 ENCOUNTER — Ambulatory Visit
Admission: RE | Admit: 2019-12-06 | Discharge: 2019-12-06 | Disposition: A | Discharge status: Home / Self Care | Payer: PRIVATE HEALTH INSURANCE | Source: Ambulatory Visit | Attending: Family Medicine | Admitting: Family Medicine

## 2019-12-06 ENCOUNTER — Other Ambulatory Visit: Payer: Self-pay

## 2019-12-06 DIAGNOSIS — Z1231 Encounter for screening mammogram for malignant neoplasm of breast: Secondary | ICD-10-CM

## 2020-10-09 ENCOUNTER — Other Ambulatory Visit: Payer: Self-pay | Admitting: Family Medicine

## 2020-10-09 DIAGNOSIS — Z1231 Encounter for screening mammogram for malignant neoplasm of breast: Secondary | ICD-10-CM

## 2020-12-06 ENCOUNTER — Ambulatory Visit
Admission: RE | Admit: 2020-12-06 | Discharge: 2020-12-06 | Disposition: A | Discharge status: Home / Self Care | Payer: BC Managed Care – PPO | Source: Ambulatory Visit | Attending: Family Medicine | Admitting: Family Medicine

## 2020-12-06 DIAGNOSIS — Z1231 Encounter for screening mammogram for malignant neoplasm of breast: Secondary | ICD-10-CM

## 2022-01-17 ENCOUNTER — Other Ambulatory Visit: Payer: Self-pay | Admitting: Family Medicine

## 2022-01-17 DIAGNOSIS — Z1231 Encounter for screening mammogram for malignant neoplasm of breast: Secondary | ICD-10-CM

## 2022-01-21 ENCOUNTER — Ambulatory Visit
Admission: RE | Admit: 2022-01-21 | Discharge: 2022-01-21 | Disposition: A | Discharge status: Home / Self Care | Payer: 59 | Source: Ambulatory Visit | Attending: Family Medicine | Admitting: Family Medicine

## 2022-01-21 DIAGNOSIS — Z1231 Encounter for screening mammogram for malignant neoplasm of breast: Secondary | ICD-10-CM

## 2022-06-23 ENCOUNTER — Ambulatory Visit (HOSPITAL_COMMUNITY): Payer: 59 | Admitting: Anesthesiology

## 2022-06-23 ENCOUNTER — Encounter (HOSPITAL_COMMUNITY)
Admission: RE | Disposition: A | Discharge status: Home / Self Care | Payer: Self-pay | Source: Home / Self Care | Attending: Urology

## 2022-06-23 ENCOUNTER — Ambulatory Visit (HOSPITAL_COMMUNITY)
Admission: RE | Admit: 2022-06-23 | Discharge: 2022-06-23 | Disposition: A | Discharge status: Home / Self Care | Payer: 59 | Attending: Urology | Admitting: Urology

## 2022-06-23 ENCOUNTER — Encounter (HOSPITAL_COMMUNITY): Payer: Self-pay | Admitting: Urology

## 2022-06-23 ENCOUNTER — Ambulatory Visit (HOSPITAL_BASED_OUTPATIENT_CLINIC_OR_DEPARTMENT_OTHER): Payer: 59 | Admitting: Anesthesiology

## 2022-06-23 ENCOUNTER — Other Ambulatory Visit: Payer: Self-pay | Admitting: Urology

## 2022-06-23 ENCOUNTER — Ambulatory Visit (HOSPITAL_COMMUNITY): Payer: 59

## 2022-06-23 DIAGNOSIS — E119 Type 2 diabetes mellitus without complications: Secondary | ICD-10-CM

## 2022-06-23 DIAGNOSIS — N201 Calculus of ureter: Secondary | ICD-10-CM

## 2022-06-23 DIAGNOSIS — Z841 Family history of disorders of kidney and ureter: Secondary | ICD-10-CM | POA: Insufficient documentation

## 2022-06-23 HISTORY — PX: CYSTOSCOPY/URETEROSCOPY/HOLMIUM LASER/STENT PLACEMENT: SHX6546

## 2022-06-23 LAB — GLUCOSE, CAPILLARY: Glucose-Capillary: 130 mg/dL — ABNORMAL HIGH (ref 70–99)

## 2022-06-23 SURGERY — CYSTOSCOPY/URETEROSCOPY/HOLMIUM LASER/STENT PLACEMENT
Anesthesia: General | Laterality: Left

## 2022-06-23 MED ORDER — SCOPOLAMINE 1 MG/3DAYS TD PT72
1.0000 | MEDICATED_PATCH | Freq: Once | TRANSDERMAL | Status: DC
  Administered 2022-06-23: 1.5 mg via TRANSDERMAL
  Filled 2022-06-23: qty 1

## 2022-06-23 MED ORDER — OXYCODONE HCL 5 MG PO TABS
ORAL_TABLET | ORAL | Status: AC
  Filled 2022-06-23: qty 1

## 2022-06-23 MED ORDER — ONDANSETRON HCL 4 MG/2ML IJ SOLN
INTRAMUSCULAR | Status: AC
  Filled 2022-06-23: qty 2

## 2022-06-23 MED ORDER — SCOPOLAMINE 1 MG/3DAYS TD PT72: 1.0000 | MEDICATED_PATCH | TRANSDERMAL | Status: DC

## 2022-06-23 MED ORDER — PROPOFOL 500 MG/50ML IV EMUL
INTRAVENOUS | Status: DC | PRN
  Administered 2022-06-23: 50 mg via INTRAVENOUS
  Administered 2022-06-23: 150 mg via INTRAVENOUS

## 2022-06-23 MED ORDER — CEFAZOLIN SODIUM-DEXTROSE 2-4 GM/100ML-% IV SOLN
2.0000 g | INTRAVENOUS | Status: AC
  Administered 2022-06-23: 2 g via INTRAVENOUS
  Filled 2022-06-23: qty 100

## 2022-06-23 MED ORDER — OXYCODONE HCL 5 MG PO TABS
5.0000 mg | ORAL_TABLET | ORAL | Status: DC | PRN
  Administered 2022-06-23: 5 mg via ORAL

## 2022-06-23 MED ORDER — FENTANYL CITRATE (PF) 100 MCG/2ML IJ SOLN
INTRAMUSCULAR | Status: DC | PRN
  Administered 2022-06-23 (×2): 50 ug via INTRAVENOUS

## 2022-06-23 MED ORDER — CELECOXIB 200 MG PO CAPS
200.0000 mg | ORAL_CAPSULE | Freq: Once | ORAL | Status: DC
  Filled 2022-06-23: qty 1

## 2022-06-23 MED ORDER — CELECOXIB 200 MG PO CAPS
200.0000 mg | ORAL_CAPSULE | Freq: Once | ORAL | Status: DC
  Administered 2022-06-23: 200 mg via ORAL

## 2022-06-23 MED ORDER — MIDAZOLAM HCL 2 MG/2ML IJ SOLN
INTRAMUSCULAR | Status: AC
  Filled 2022-06-23: qty 2

## 2022-06-23 MED ORDER — PHENYLEPHRINE 80 MCG/ML (10ML) SYRINGE FOR IV PUSH (FOR BLOOD PRESSURE SUPPORT)
PREFILLED_SYRINGE | INTRAVENOUS | Status: DC | PRN
  Administered 2022-06-23 (×4): 80 ug via INTRAVENOUS

## 2022-06-23 MED ORDER — SODIUM CHLORIDE 0.9% FLUSH: 3.0000 mL | Freq: Two times a day (BID) | INTRAVENOUS | Status: DC

## 2022-06-23 MED ORDER — PHENYLEPHRINE 80 MCG/ML (10ML) SYRINGE FOR IV PUSH (FOR BLOOD PRESSURE SUPPORT)
PREFILLED_SYRINGE | INTRAVENOUS | Status: AC
  Filled 2022-06-23: qty 10

## 2022-06-23 MED ORDER — DEXAMETHASONE SODIUM PHOSPHATE 10 MG/ML IJ SOLN
INTRAMUSCULAR | Status: DC | PRN
  Administered 2022-06-23: 10 mg via INTRAVENOUS

## 2022-06-23 MED ORDER — LIDOCAINE 2% (20 MG/ML) 5 ML SYRINGE
INTRAMUSCULAR | Status: DC | PRN
  Administered 2022-06-23: 80 mg via INTRAVENOUS

## 2022-06-23 MED ORDER — ONDANSETRON HCL 4 MG/2ML IJ SOLN
INTRAMUSCULAR | Status: DC | PRN
  Administered 2022-06-23: 4 mg via INTRAVENOUS

## 2022-06-23 MED ORDER — LACTATED RINGERS IV SOLN: INTRAVENOUS | Status: DC

## 2022-06-23 MED ORDER — PROPOFOL 10 MG/ML IV BOLUS
INTRAVENOUS | Status: AC
  Filled 2022-06-23: qty 20

## 2022-06-23 MED ORDER — CHLORHEXIDINE GLUCONATE 0.12 % MT SOLN
15.0000 mL | Freq: Once | OROMUCOSAL | Status: AC
  Administered 2022-06-23: 15 mL via OROMUCOSAL

## 2022-06-23 MED ORDER — HYDROCODONE-ACETAMINOPHEN 5-325 MG PO TABS: 1.0000 | ORAL_TABLET | ORAL | 0 refills | Status: AC | PRN

## 2022-06-23 MED ORDER — FENTANYL CITRATE (PF) 100 MCG/2ML IJ SOLN
INTRAMUSCULAR | Status: AC
  Filled 2022-06-23: qty 2

## 2022-06-23 MED ORDER — ACETAMINOPHEN 500 MG PO TABS: 1000.0000 mg | ORAL_TABLET | Freq: Once | ORAL | Status: DC

## 2022-06-23 MED ORDER — ACETAMINOPHEN 500 MG PO TABS
1000.0000 mg | ORAL_TABLET | Freq: Once | ORAL | Status: AC
  Administered 2022-06-23: 1000 mg via ORAL
  Filled 2022-06-23: qty 2

## 2022-06-23 MED ORDER — MIDAZOLAM HCL 2 MG/2ML IJ SOLN
INTRAMUSCULAR | Status: DC | PRN
  Administered 2022-06-23: 2 mg via INTRAVENOUS

## 2022-06-23 MED ORDER — IOHEXOL 300 MG/ML  SOLN
INTRAMUSCULAR | Status: DC | PRN
  Administered 2022-06-23: 4 mL via URETHRAL

## 2022-06-23 SURGICAL SUPPLY — 23 items
BAG URO CATCHER STRL LF (MISCELLANEOUS) ×1 IMPLANT
BASKET STONE NCOMPASS (UROLOGICAL SUPPLIES) IMPLANT
CATH URETERAL DUAL LUMEN 10F (MISCELLANEOUS) IMPLANT
CATH URETL OPEN 5X70 (CATHETERS) IMPLANT
CLOTH BEACON ORANGE TIMEOUT ST (SAFETY) ×1 IMPLANT
EXTRACTOR STONE NITINOL NGAGE (UROLOGICAL SUPPLIES) IMPLANT
GLOVE SURG SS PI 8.0 STRL IVOR (GLOVE) ×1 IMPLANT
GOWN STRL REUS W/ TWL XL LVL3 (GOWN DISPOSABLE) ×1 IMPLANT
GOWN STRL REUS W/TWL XL LVL3 (GOWN DISPOSABLE) ×1
GUIDEWIRE STR DUAL SENSOR (WIRE) ×1 IMPLANT
IV NS IRRIG 3000ML ARTHROMATIC (IV SOLUTION) ×1 IMPLANT
KIT TURNOVER KIT A (KITS) IMPLANT
LASER FIB FLEXIVA PULSE ID 365 (Laser) IMPLANT
LASER FIB FLEXIVA PULSE ID 550 (Laser) IMPLANT
LASER FIB FLEXIVA PULSE ID 910 (Laser) IMPLANT
MANIFOLD NEPTUNE II (INSTRUMENTS) ×1 IMPLANT
PACK CYSTO (CUSTOM PROCEDURE TRAY) ×1 IMPLANT
SHEATH NAVIGATOR HD 11/13X36 (SHEATH) IMPLANT
STENT URET 6FRX24 CONTOUR (STENTS) IMPLANT
TRACTIP FLEXIVA PULS ID 200XHI (Laser) IMPLANT
TRACTIP FLEXIVA PULSE ID 200 (Laser) ×1
TUBING CONNECTING 10 (TUBING) ×1 IMPLANT
TUBING UROLOGY SET (TUBING) ×1 IMPLANT

## 2022-06-23 NOTE — Anesthesia Procedure Notes (Signed)
Procedure Name: LMA Insertion Date/Time: 06/23/2022 5:48 PM  Performed by: Marny Lowenstein, CRNAPre-anesthesia Checklist: Patient identified, Emergency Drugs available, Suction available and Patient being monitored Patient Re-evaluated:Patient Re-evaluated prior to induction Oxygen Delivery Method: Circle system utilized Preoxygenation: Pre-oxygenation with 100% oxygen Induction Type: IV induction Ventilation: Mask ventilation without difficulty LMA: LMA inserted LMA Size: 4.0 Number of attempts: 1 Placement Confirmation: positive ETCO2 and breath sounds checked- equal and bilateral Tube secured with: Tape Dental Injury: Teeth and Oropharynx as per pre-operative assessment

## 2022-06-23 NOTE — Discharge Instructions (Addendum)
I wanted to stent to stay in for a week so I didn't leave a string so the stent will be removed in the office when you return.   Please bring your stones to the office for analysis.

## 2022-06-23 NOTE — Op Note (Signed)
Procedure: 1.  Cystoscopy with left retrograde pyelogram and interpretation. 2.  Left ureteroscopy with holmium laser application, stone extraction and insertion of left double-J stent. 3.  Application of fluoroscopy.  Preop diagnosis: 8 mm left proximal ureteral stone.  Postop diagnosis: Same.  Surgeon: Dr. Bjorn Pippin.  Anesthesia: General.  Specimen: Stone fragments.  Drains: 6 French by 24 cm left contour double-J stent without tether.  EBL: None.  Complications: None.  Indications: The patient is a 62 year old female with a history of stones who was seen in the office today for left flank pain with nausea that have been present intermittently since June 11 when she was found to have been 8 mm stone on CT scan with obstruction.  On KUB today the stone is faintly visible I think along the lower lumbar spine on the left.  I discussed the options of ureteroscopy versus lithotripsy and with the faint visualization and her ongoing symptoms I recommended we proceed with ureteroscopy.  Procedure: She was taken the operating room where she was given an antibiotic.  A general anesthetic was induced.  She was placed in lithotomy position and fitted with PAS hose.  Her perineum and genitalia were prepped with Betadine solution she was draped in usual sterile fashion.  Cystoscopy was performed and 21 [place] scope and 30 degree lens.  Examination revealed a normal urethra.  The bladder wall was smooth and pale without tumors, stones or inflammation.  Ureteral orifices were unremarkable.  The left ureteral orifice was cannulated with 5 [place] open-ended catheter and contrast was instilled.  The left retrograde pyelogram demonstrated a normal ureter up to a filling defect in the proximal ureter consistent with a stone with some flow contrast by the stone into the dilated collecting system.  A sensor wire was then advanced through the open-ended catheter by the stone into the kidney without  difficulty.  The cystoscope was removed and the wire in place and the 11 [place] by 36 cm digital access sheath inner core was then passed over the wire to the stone without difficulty.  This was followed by the assembled sheath and the inner core and wire removed.  A dual-lumen digital flexible scope was then advanced to the level of the stone but due to angulation I was unable to get a good view of the stone.  I then advanced a wire back through the ureteroscope into the kidney and remove the ureteroscope.  A dual-lumen 8 French catheter was then passed by the stone over the wire and a second wire was placed.  The dual-lumen catheter was removed and the access sheath was placed over one of the 2 wires and the other wire was coiled and secured while the inner core of the sheath and that wire were removed.  The dual-lumen digital scope was then advanced through the sheath and the stone was more readily visible alongside the wire which helped straighten the ureter.  The stone was then engaged with 242 m tract tip laser fiber on the dusting setting with 0.3 J and 60 Hz on the left pedal and 1 J and 8 Hz on the right pedal.  The dusting setting was primarily used and the stone fragmented readily.  The fragments were then removed using an in encompass basket and once all significant fragments were removed the ureteroscope was passed to the kidney and the collecting system was inspected with only a few bits of grit seen in the collecting system.  The ureteroscope and access sheath  were then removed and the cystoscope was then reinserted over the wire and a 6 [place] by 24 cm contour double-J stent without tether was advanced the kidney under fluoroscopic guidance.  I elected to forego the tether as she did have some inflammation and tortuosity of the ureter and I wanted to make sure it stayed in place for at least a week.  Her bladder was drained and the cystoscope was removed.  She was taken down from lithotomy  position, her anesthetic was reversed and she was moved recovery in stable condition.  There were no complications.  The stone fragments to be given to her family to bring to the office.

## 2022-06-23 NOTE — Transfer of Care (Signed)
Immediate Anesthesia Transfer of Care Note  Patient: Laurie Garcia  Procedure(s) Performed: CYSTOSCOPY/URETEROSCOPY/HOLMIUM LASER/STENT PLACEMENT (Left)  Patient Location: PACU  Anesthesia Type:General  Level of Consciousness: drowsy and patient cooperative  Airway & Oxygen Therapy: Patient Spontanous Breathing and Patient connected to face mask oxygen  Post-op Assessment: Report given to RN and Post -op Vital signs reviewed and stable  Post vital signs: Reviewed and stable  Last Vitals:  Vitals Value Taken Time  BP 175/81 06/23/22 1841  Temp    Pulse 99 06/23/22 1843  Resp 15 06/23/22 1843  SpO2 100 % 06/23/22 1843  Vitals shown include unvalidated device data.  Last Pain:  Vitals:   06/23/22 1546  TempSrc: Oral  PainSc: 5       Patients Stated Pain Goal: 0 (06/23/22 1546)  Complications: No notable events documented.

## 2022-06-23 NOTE — Anesthesia Postprocedure Evaluation (Signed)
Anesthesia Post Note  Patient: Laurie Garcia  Procedure(s) Performed: CYSTOSCOPY/URETEROSCOPY/HOLMIUM LASER/STENT PLACEMENT (Left)     Patient location during evaluation: PACU Anesthesia Type: General Level of consciousness: awake and alert Pain management: pain level controlled Vital Signs Assessment: post-procedure vital signs reviewed and stable Respiratory status: spontaneous breathing, nonlabored ventilation and respiratory function stable Cardiovascular status: blood pressure returned to baseline and stable Postop Assessment: no apparent nausea or vomiting Anesthetic complications: no  No notable events documented.  Last Vitals:  Vitals:   06/23/22 1900 06/23/22 1915  BP: (!) 170/93 (!) 178/96  Pulse: 98 99  Resp: 15 14  Temp:    SpO2: 96% 98%    Last Pain:  Vitals:   06/23/22 1915  TempSrc:   PainSc: 0-No pain                 Manette Doto,W. EDMOND

## 2022-06-23 NOTE — H&P (Signed)
I have ureteral stone.  HPI: Laurie Garcia is a 62 year-old female patient who was referred by Dr. Elana Alm. Laurie Hammers, MD who is here for ureteral stone.    Laurie Garcia is a 62 yo female with a history of stones who had the onset last Monday of severe pain in the left flank with nausea and vomiting and she had a CT on 06/17/22 that showed an 8mm stone in the left proximal with obstruction and 2 2mm right renal stones. She didn't have fever. She continues to have intermittent pain with the last episode last night. She is on Oxempic but not in the last week. She has had no voiding complaints. She was given tamsulosin which helps her void. She has Ca Ox and Uric acid stones. The current stone has 500-600HU max but on KUB today it is only faintly visible. She has had a right PCNL and she had a ureteroscopy as well. She has had right renal cyst aspirated in the past. .      ALLERGIES: Codeine Derivatives Latex OXYCODONE TraMADol HCl TABS Tylox CAPS    MEDICATIONS: Flomax  Hydrocodone-Acetaminophen  Ibuprofen  Losartan Potassium  Metformin Hcl 500 mg tablet 1 Oral 4 times daily  Multi-Vitamin Oral Tablet Oral  Ozempic  Tylenol     GU PSH: PCNL - 2012 Renal cyst aspiration - 2012 Rpr Umbil Hern; Reduc < 5 Yr - 2012       PSH Notes: Percutaneous Lithotomy, Tonsillectomy With Adenoidectomy, Urological Procedures Renal Cyst Aspiration, Dilation Of Female Urethra, Lithotomy For Large Staghorn Calculus, Umbilical Hernia Repair   NON-GU PSH: Remove Kidney Stone - 2012 Remove Tonsils And Adenoids - 2012     GU PMH: Abdominal Pain Unspec, Right flank pain - 2015 Gross hematuria, Gross hematuria - 2015 Renal calculus, Kidney stone on right side - 2014 Renal cyst, Renal cyst, acquired - 2014 Urinary Urgency, Urinary urgency - 2014      PMH Notes:  2010-03-15 13:35:49 - Note: Arthritis   NON-GU PMH: Encounter for general adult medical examination without abnormal findings, Encounter for preventive  health examination - 2015 Glycosuria, Glycosuria - 2015 Abdominal tenderness, unspecified site, Suprapubic tenderness - 2014 Personal history of other diseases of the digestive system, History of esophageal reflux - 2014 Personal history of other endocrine, nutritional and metabolic disease, History of diabetes mellitus - 2014 Arthritis Diabetes Type 2 GERD Other psoriasis Polycystic ovarian syndrome    FAMILY HISTORY: 3 Son's - Other Bladder Cancer - Runs In Family Diabetes - Father Family Health Status - Father alive at age 73 - Runs In Family Family Health Status - Mother's Age - Runs In Family Family Health Status Number - Runs In Family Hematuria - Runs In Family kidney disease - Father Kidney Failure - Runs in Family nephrolithiasis - Runs In Family No pertinent family history - Other Prostate Cancer - Runs In Family   SOCIAL HISTORY: Marital Status: Married Preferred Language: English; Race: White Current Smoking Status: Patient has never smoked.   Tobacco Use Assessment Completed: Used Tobacco in last 30 days? Drinks 3 caffeinated drinks per day.     Notes: Never A Smoker, Marital History - Currently Married, Tobacco Use, Caffeine Use, Alcohol Use, Occupation:   REVIEW OF SYSTEMS:    GU Review Female:   Patient reports frequent urination, get up at night to urinate, and stream starts and stops. Patient denies hard to postpone urination, burning /pain with urination, leakage of urine, trouble starting your stream, have to strain  to urinate, and being pregnant.  Gastrointestinal (Upper):   Patient reports nausea and vomiting. Patient denies indigestion/ heartburn.  Gastrointestinal (Lower):   Patient reports diarrhea. Patient denies constipation.  Constitutional:   Patient reports fatigue. Patient denies fever, night sweats, and weight loss.  Skin:   Patient reports skin rash/ lesion and itching.   Eyes:   Patient reports blurred vision. Patient denies double vision.   Ears/ Nose/ Throat:   Patient denies sore throat and sinus problems.  Hematologic/Lymphatic:   Patient denies swollen glands and easy bruising.  Cardiovascular:   Patient denies chest pains and leg swelling.  Respiratory:   Patient denies cough and shortness of breath.  Endocrine:   Patient denies excessive thirst.  Musculoskeletal:   Patient reports back pain and joint pain.   Neurological:   Patient denies headaches and dizziness.  Psychologic:   Patient denies depression and anxiety.   Notes: painful intercourse     VITAL SIGNS:      06/23/2022 09:01 AM  Weight 200 lb / 90.72 kg  Height 65 in / 165.1 cm  BP 149/85 mmHg  Heart Rate 66 /min  Temperature 98.0 F / 36.6 C  BMI 33.3 kg/m   MULTI-SYSTEM PHYSICAL EXAMINATION:    Constitutional: Obese. No physical deformities. Normally developed. Good grooming.   Neck: Neck symmetrical, not swollen. Normal tracheal position.  Respiratory: Normal breath sounds. No labored breathing, no use of accessory muscles.   Cardiovascular: Regular rate and rhythm. No murmur, no gallop.   Skin: No paleness, no jaundice, no cyanosis. No lesion, no ulcer, no rash.  Neurologic / Psychiatric: Oriented to time, oriented to place, oriented to person. No depression, no anxiety, no agitation.  Gastrointestinal: Abdominal tenderness, left CVAT mild, obese. No mass, no rigidity.   Musculoskeletal: Normal gait and station of head and neck.     Complexity of Data:  Lab Test Review:   BMP  Records Review:   AUA Symptom Score, Previous Patient Records  Urine Test Review:   Urinalysis  X-Ray Review: C.T. Abdomen/Pelvis: Reviewed Films. Reviewed Report. Discussed With Patient.     PROCEDURES:          Urinalysis w/Scope Dipstick Dipstick Cont'd Micro  Color: Yellow Bilirubin: Neg mg/dL WBC/hpf: 0 - 5/hpf  Appearance: Clear Ketones: Neg mg/dL RBC/hpf: 3 - 69/GEX  Specific Gravity: 1.025 Blood: Neg ery/uL Bacteria: Rare (0-9/hpf)  pH: 6.0 Protein: 3+  mg/dL Cystals: NS (Not Seen)  Glucose: Neg mg/dL Urobilinogen: 0.2 mg/dL Casts: NS (Not Seen)    Nitrites: Neg Trichomonas: Not Present    Leukocyte Esterase: Trace leu/uL Mucous: Not Present      Epithelial Cells: 0 - 5/hpf      Yeast: NS (Not Seen)      Sperm: Not Present    ASSESSMENT:      ICD-10 Details  1 GU:   Ureteral calculus - N20.1    PLAN:           Orders X-Rays: KUB          Schedule Return Visit/Planned Activity: ASAP - Schedule Surgery  Procedure: 06/23/2022 - Cysto Uretero Lithotripsy - [id number], left  Procedure: 06/23/2022 - Cysto Uretero Lithotripsy - [phone], left          Document Letter(s):  Created for Patient: Clinical Summary   Created for Patient: Clinical Summary         Notes:   CC: Dr. Keturah Barre.         Next Appointment:  Next Appointment: 07/01/2022 03:30 PM    Appointment Type: Postoperative Appointment    Location: [facility] [phone]    Provider: Bartholomew Crews, NP    Reason for Visit: POST OP STENT REMOVAL

## 2022-06-23 NOTE — Anesthesia Preprocedure Evaluation (Signed)
Anesthesia Evaluation  Patient identified by MRN, date of birth, ID band Patient awake    Reviewed: Allergy & Precautions, H&P , NPO status , Patient's Chart, lab work & pertinent test results  Airway Mallampati: III  TM Distance: >3 FB Neck ROM: Full    Dental no notable dental hx. (+) Teeth Intact, Dental Advisory Given, Caps   Pulmonary neg pulmonary ROS   Pulmonary exam normal breath sounds clear to auscultation       Cardiovascular Exercise Tolerance: Good negative cardio ROS Normal cardiovascular exam Rhythm:Regular Rate:Normal     Neuro/Psych negative neurological ROS  negative psych ROS   GI/Hepatic negative GI ROS, Neg liver ROS,GERD  Medicated,,  Endo/Other  diabetes    Renal/GU Renal diseasenegative Renal ROS  negative genitourinary   Musculoskeletal  (+) Arthritis , Osteoarthritis,    Abdominal   Peds negative pediatric ROS (+)  Hematology negative hematology ROS (+)   Anesthesia Other Findings   Reproductive/Obstetrics negative OB ROS                             Anesthesia Physical Anesthesia Plan  ASA: 3  Anesthesia Plan: General   Post-op Pain Management: Tylenol PO (pre-op)* and Celebrex PO (pre-op)*   Induction: Intravenous  PONV Risk Score and Plan: 3 and Dexamethasone, Scopolamine patch - Pre-op and Treatment may vary due to age or medical condition  Airway Management Planned: LMA  Additional Equipment: None  Intra-op Plan:   Post-operative Plan:   Informed Consent: I have reviewed the patients History and Physical, chart, labs and discussed the procedure including the risks, benefits and alternatives for the proposed anesthesia with the patient or authorized representative who has indicated his/her understanding and acceptance.       Plan Discussed with: CRNA, Surgeon and Anesthesiologist  Anesthesia Plan Comments: ( )        Anesthesia  Quick Evaluation

## 2022-06-24 ENCOUNTER — Encounter (HOSPITAL_COMMUNITY): Payer: Self-pay | Admitting: Urology

## 2022-12-23 ENCOUNTER — Other Ambulatory Visit: Payer: Self-pay | Admitting: Family Medicine

## 2022-12-23 DIAGNOSIS — Z1231 Encounter for screening mammogram for malignant neoplasm of breast: Secondary | ICD-10-CM

## 2023-01-27 ENCOUNTER — Ambulatory Visit
Admission: RE | Admit: 2023-01-27 | Discharge: 2023-01-27 | Disposition: A | Discharge status: Home / Self Care | Source: Ambulatory Visit | Attending: Family Medicine | Admitting: Family Medicine

## 2023-01-27 DIAGNOSIS — Z1231 Encounter for screening mammogram for malignant neoplasm of breast: Secondary | ICD-10-CM

## 2023-07-02 ENCOUNTER — Emergency Department (HOSPITAL_COMMUNITY)
Admission: EM | Admit: 2023-07-02 | Discharge: 2023-07-02 | Disposition: A | Discharge status: Home / Self Care | Attending: Emergency Medicine | Admitting: Emergency Medicine

## 2023-07-02 ENCOUNTER — Encounter (HOSPITAL_COMMUNITY): Payer: Self-pay

## 2023-07-02 ENCOUNTER — Other Ambulatory Visit: Payer: Self-pay

## 2023-07-02 DIAGNOSIS — R739 Hyperglycemia, unspecified: Secondary | ICD-10-CM | POA: Diagnosis present

## 2023-07-02 DIAGNOSIS — I10 Essential (primary) hypertension: Secondary | ICD-10-CM | POA: Diagnosis not present

## 2023-07-02 DIAGNOSIS — R030 Elevated blood-pressure reading, without diagnosis of hypertension: Secondary | ICD-10-CM

## 2023-07-02 DIAGNOSIS — E860 Dehydration: Secondary | ICD-10-CM | POA: Diagnosis not present

## 2023-07-02 LAB — COMPREHENSIVE METABOLIC PANEL WITH GFR
ALT: 19 U/L (ref 0–44)
AST: 15 U/L (ref 15–41)
Albumin: 3.9 g/dL (ref 3.5–5.0)
Alkaline Phosphatase: 64 U/L (ref 38–126)
Anion gap: 15 (ref 5–15)
BUN: 17 mg/dL (ref 8–23)
CO2: 20 mmol/L — ABNORMAL LOW (ref 22–32)
Calcium: 9.3 mg/dL (ref 8.9–10.3)
Chloride: 98 mmol/L (ref 98–111)
Creatinine, Ser: 0.8 mg/dL (ref 0.44–1.00)
GFR, Estimated: >60 mL/min (ref >60)
Glucose, Bld: 351 mg/dL — ABNORMAL HIGH (ref 70–99)
Potassium: 4.3 mmol/L (ref 3.5–5.1)
Sodium: 133 mmol/L — ABNORMAL LOW (ref 135–145)
Total Bilirubin: 0.7 mg/dL (ref 0.0–1.2)
Total Protein: 7.2 g/dL (ref 6.5–8.1)

## 2023-07-02 LAB — URINALYSIS, ROUTINE W REFLEX MICROSCOPIC
Bilirubin Urine: NEGATIVE
Glucose, UA: >=500 mg/dL — AB
Hgb urine dipstick: NEGATIVE
Ketones, ur: 20 mg/dL — AB
Leukocytes,Ua: NEGATIVE
Nitrite: NEGATIVE
Protein, ur: 100 mg/dL — AB
Specific Gravity, Urine: 1.031 — ABNORMAL HIGH (ref 1.005–1.030)
pH: 5 (ref 5.0–8.0)

## 2023-07-02 LAB — CBG MONITORING, ED
Glucose-Capillary: 358 mg/dL — ABNORMAL HIGH (ref 70–99)
Glucose-Capillary: 262 mg/dL — ABNORMAL HIGH (ref 70–99)
Glucose-Capillary: 151 mg/dL — ABNORMAL HIGH (ref 70–99)

## 2023-07-02 LAB — CBC
HCT: 37.8 % (ref 36.0–46.0)
Hemoglobin: 12.5 g/dL (ref 12.0–15.0)
MCH: 29.6 pg (ref 26.0–34.0)
MCHC: 33.1 g/dL (ref 30.0–36.0)
MCV: 89.6 fL (ref 80.0–100.0)
Platelets: 270 10*3/uL (ref 150–400)
RBC: 4.22 MIL/uL (ref 3.87–5.11)
RDW: 13 % (ref 11.5–15.5)
WBC: 5.3 10*3/uL (ref 4.0–10.5)
nRBC: 0 % (ref 0.0–0.2)

## 2023-07-02 LAB — LIPASE, BLOOD: Lipase: 48 U/L (ref 11–51)

## 2023-07-02 MED ORDER — MOUNJARO 12.5 MG/0.5ML ~~LOC~~ SOAJ: 12.5000 mg | SUBCUTANEOUS | 0 refills | Status: AC

## 2023-07-02 MED ORDER — INSULIN ASPART 100 UNIT/ML IJ SOLN
12.0000 [IU] | Freq: Once | INTRAMUSCULAR | Status: AC
  Administered 2023-07-02: 12 [IU] via SUBCUTANEOUS
  Filled 2023-07-02: qty 0.12

## 2023-07-02 MED ORDER — INSULIN ASPART 100 UNIT/ML IJ SOLN
8.0000 [IU] | Freq: Once | INTRAMUSCULAR | Status: AC
  Administered 2023-07-02: 8 [IU] via INTRAVENOUS
  Filled 2023-07-02: qty 0.08

## 2023-07-02 MED ORDER — LACTATED RINGERS IV BOLUS
1000.0000 mL | Freq: Once | INTRAVENOUS | Status: AC
  Administered 2023-07-02: 1000 mL via INTRAVENOUS

## 2023-07-02 NOTE — ED Provider Notes (Signed)
 [place] EMERGENCY DEPARTMENT AT [facility] Provider Note   CSN: [id number] Arrival date & time: 07/02/23  1135     Patient presents with: Hyperglycemia   Laurie Garcia is a 63 y.o. female.   Pt with niddm, presents indicating in past couple days her blood sugars have been in 400s. Takes metformin, had been on mounjaro but is not currently. +polyuria and polydipsia. No vomiting. No abd pain or distension. No fever or chills.   The history is provided by the patient and medical records.  Abdominal Pain Associated symptoms: no chest pain, no chills, no cough, no diarrhea, no dysuria, no fever, no shortness of breath, no sore throat and no vomiting   Hyperglycemia Associated symptoms: abdominal pain   Associated symptoms: no chest pain, no dysuria, no fever, no shortness of breath and no vomiting        Prior to Admission medications   Medication Sig Start Date End Date Taking? Authorizing Provider  losartan (COZAAR) 25 MG tablet Take 25 mg by mouth daily. 09/15/14   [provider]  metFORMIN (GLUCOPHAGE) 500 MG tablet Take 500 mg by mouth 4 (four) times daily.    [provider]  Multiple Vitamin (MULTIVITAMIN) capsule Take 1 capsule by mouth daily.    [provider]  Multiple Vitamins-Minerals (PRESERVISION AREDS PO) Take 1 capsule by mouth daily.    [provider]  OZEMPIC, 2 MG/DOSE, 8 MG/3ML SOPN Inject 2 mg into the skin once a week.    [provider]    Allergies: Codeine, Latex, Oxycodone , and Ultram [tramadol hcl]    Review of Systems  Constitutional:  Negative for chills and fever.  HENT:  Negative for sore throat.   Respiratory:  Negative for cough and shortness of breath.   Cardiovascular:  Negative for chest pain and leg swelling.  Gastrointestinal:  Positive for abdominal pain. Negative for diarrhea and vomiting.  Genitourinary:  Negative for dysuria and flank pain.  Musculoskeletal:  Negative for  back pain.  Neurological:  Negative for headaches.    Updated Vital Signs BP (!) 159/114 (BP Location: Right Arm)   Pulse 91   Temp 98.8 F (37.1 C) (Oral)   Resp 18   Ht 1.651 m (5' 5)   Wt 88.5 kg   LMP 11/21/2011   SpO2 98%   BMI 32.45 kg/m   Physical Exam Vitals and nursing note reviewed.  Constitutional:      Appearance: Normal appearance. She is well-developed.  HENT:     Head: Atraumatic.     Nose: Nose normal.     Mouth/Throat:     Mouth: Mucous membranes are moist.   Eyes:     General: No scleral icterus.    Conjunctiva/sclera: Conjunctivae normal.   Neck:     Trachea: No tracheal deviation.   Cardiovascular:     Rate and Rhythm: Normal rate and regular rhythm.     Pulses: Normal pulses.     Heart sounds: Normal heart sounds. No murmur heard.    No friction rub. No gallop.  Pulmonary:     Effort: Pulmonary effort is normal. No respiratory distress.     Breath sounds: Normal breath sounds.  Abdominal:     General: Bowel sounds are normal. There is no distension.     Palpations: Abdomen is soft.     Tenderness: There is no abdominal tenderness. There is no guarding.  Genitourinary:    Comments: No cva tenderness.   Musculoskeletal:  General: No swelling or tenderness.     Cervical back: Normal range of motion and neck supple. No rigidity. No muscular tenderness.   Skin:    General: Skin is warm and dry.     Findings: No rash.   Neurological:     Mental Status: She is alert.     Comments: Alert, speech normal.   Psychiatric:        Mood and Affect: Mood normal.     (all labs ordered are listed, but only abnormal results are displayed) Results for orders placed or performed during the hospital encounter of 07/02/23  CBG monitoring, ED   Collection Time: 07/02/23 11:47 AM  Result Value Ref Range   Glucose-Capillary 358 (H) 70 - 99 mg/dL  Urinalysis, Routine w reflex microscopic -Urine, Clean Catch   Collection Time: 07/02/23 12:14 PM   Result Value Ref Range   Color, Urine YELLOW YELLOW   APPearance CLEAR CLEAR   Specific Gravity, Urine 1.031 (H) 1.005 - 1.030   pH 5.0 5.0 - 8.0   Glucose, UA >=500 (A) NEGATIVE mg/dL   Hgb urine dipstick NEGATIVE NEGATIVE   Bilirubin Urine NEGATIVE NEGATIVE   Ketones, ur 20 (A) NEGATIVE mg/dL   Protein, ur 899 (A) NEGATIVE mg/dL   Nitrite NEGATIVE NEGATIVE   Leukocytes,Ua NEGATIVE NEGATIVE   RBC / HPF 0-5 0 - 5 RBC/hpf   WBC, UA 0-5 0 - 5 WBC/hpf   Bacteria, UA RARE (A) NONE SEEN   Squamous Epithelial / HPF 0-5 0 - 5 /HPF   Hyaline Casts, UA PRESENT   Lipase, blood   Collection Time: 07/02/23 12:31 PM  Result Value Ref Range   Lipase 48 11 - 51 U/L  Comprehensive metabolic panel   Collection Time: 07/02/23 12:31 PM  Result Value Ref Range   Sodium 133 (L) 135 - 145 mmol/L   Potassium 4.3 3.5 - 5.1 mmol/L   Chloride 98 98 - 111 mmol/L   CO2 20 (L) 22 - 32 mmol/L   Glucose, Bld 351 (H) 70 - 99 mg/dL   BUN 17 8 - 23 mg/dL   Creatinine, Ser 9.19 0.44 - 1.00 mg/dL   Calcium 9.3 8.9 - 89.6 mg/dL   Total Protein 7.2 6.5 - 8.1 g/dL   Albumin 3.9 3.5 - 5.0 g/dL   AST 15 15 - 41 U/L   ALT 19 0 - 44 U/L   Alkaline Phosphatase 64 38 - 126 U/L   Total Bilirubin 0.7 0.0 - 1.2 mg/dL   GFR, Estimated >39 >39 mL/min   Anion gap 15 5 - 15  CBC   Collection Time: 07/02/23 12:31 PM  Result Value Ref Range   WBC 5.3 4.0 - 10.5 K/uL   RBC 4.22 3.87 - 5.11 MIL/uL   Hemoglobin 12.5 12.0 - 15.0 g/dL   HCT 62.1 63.9 - 53.9 %   MCV 89.6 80.0 - 100.0 fL   MCH 29.6 26.0 - 34.0 pg   MCHC 33.1 30.0 - 36.0 g/dL   RDW 86.9 88.4 - 84.4 %   Platelets 270 150 - 400 K/uL   nRBC 0.0 0.0 - 0.2 %     EKG: None  Radiology: No results found.   Procedures   Medications Ordered in the ED  lactated ringers  bolus 1,000 mL (has no administration in time range)  lactated ringers  bolus 1,000 mL (1,000 mLs Intravenous New Bag/Given 07/02/23 1413)  insulin aspart (novoLOG) injection 12 Units (12  Units Subcutaneous Given 07/02/23 1414)  Medical Decision Making Problems Addressed: Dehydration: acute illness or injury with systemic symptoms Elevated blood pressure reading: acute illness or injury Essential hypertension: chronic illness or injury with exacerbation, progression, or side effects of treatment that poses a threat to life or bodily functions Hyperglycemia: acute illness or injury with systemic symptoms  Amount and/or Complexity of Data Reviewed External Data Reviewed: notes. Labs: ordered. Decision-making details documented in ED Course.  Risk Prescription drug management. Decision regarding hospitalization.   Iv ns. Continuous pulse ox and cardiac monitoring. Labs ordered/sent.   Differential diagnosis includes hyperglycemia, dka, dehydration, etc. Dispo decision including potential need for admission considered - will get labs and reassess.   Reviewed nursing notes and prior charts for additional history. External reports reviewed. Pt reports having routine f/u visit at [facility] urology - indicates blood sugar in 400s then, and was ct - was told ct neg for acute process.   Cardiac monitor: sinus rhythm, rate 94.  LR bolus. Novolog sq.   Labs reviewed/interpreted by me - glucose high. Hco3 is normal. Ua neg for uti. Wbc and hgb normal. AG normal.   Ivfs, repeat cbgs pending - signed out to oncoming edp to check glucose, recheck pt, and dispo appropriately.       Final diagnoses:  Hyperglycemia  Dehydration  Elevated blood pressure reading  Essential hypertension    ED Discharge Orders     None          Bernard Drivers, MD 07/02/23 1524

## 2023-07-02 NOTE — ED Triage Notes (Signed)
 Pt presents to ED from home C/O hyperglycemia, reports CBG has been in 400s for 2 days. Takes metformin, recently stopped monjourno injection. Also reports abdominal pain, bloating X 2 days.

## 2023-07-02 NOTE — ED Provider Notes (Signed)
 Pt signed out by Dr. Bernard pending BS improvement.  Pt's BS has dropped, but it is 303 on her monitor.  Pt will be given additional IVFs and 1 more dose of Novolog insulin IV 8 units.  Pt was on Semglee prior to taking Mounjaro.  She stopped taking the Mounjaro prior to knee surgery in April.  Hgb A1c has climbed back up to 9 and she's willing to go back on the Mounjaro.  Rx supplied for pt.  She is stable for d/c.  Return if worse.  F/u with pcp.   Dean Clarity, MD 07/02/23 [id number]

## 2023-07-02 NOTE — Discharge Instructions (Addendum)
 It was our pleasure to provide your ER care today - we hope that you feel better.  Drink plenty of water/fluids - stay well hydrated.   Continue metformin.  Follow diabetes meal plan.   Follow up closely with primary care doctor in the next 2-3 days.   Return to ER if worse, new symptoms, fevers, new or worsening or severe abdominal pain, persistent vomiting, weak/fainting, or other concern.

## 2023-07-04 IMAGING — MG MM DIGITAL SCREENING BILAT W/ TOMO AND CAD
6 of 10 series · 6 of 30 positions shown · non-contrast
Comparison: Previous exam(s).

CLINICAL DATA: Screening.

EXAM:
DIGITAL SCREENING BILATERAL MAMMOGRAM WITH TOMOSYNTHESIS AND CAD
TECHNIQUE: Bilateral screening digital craniocaudal and mediolateral oblique
mammograms were obtained. Bilateral screening digital breast
tomosynthesis was performed. The images were evaluated with
computer-aided detection.

[R CC synth-2D]
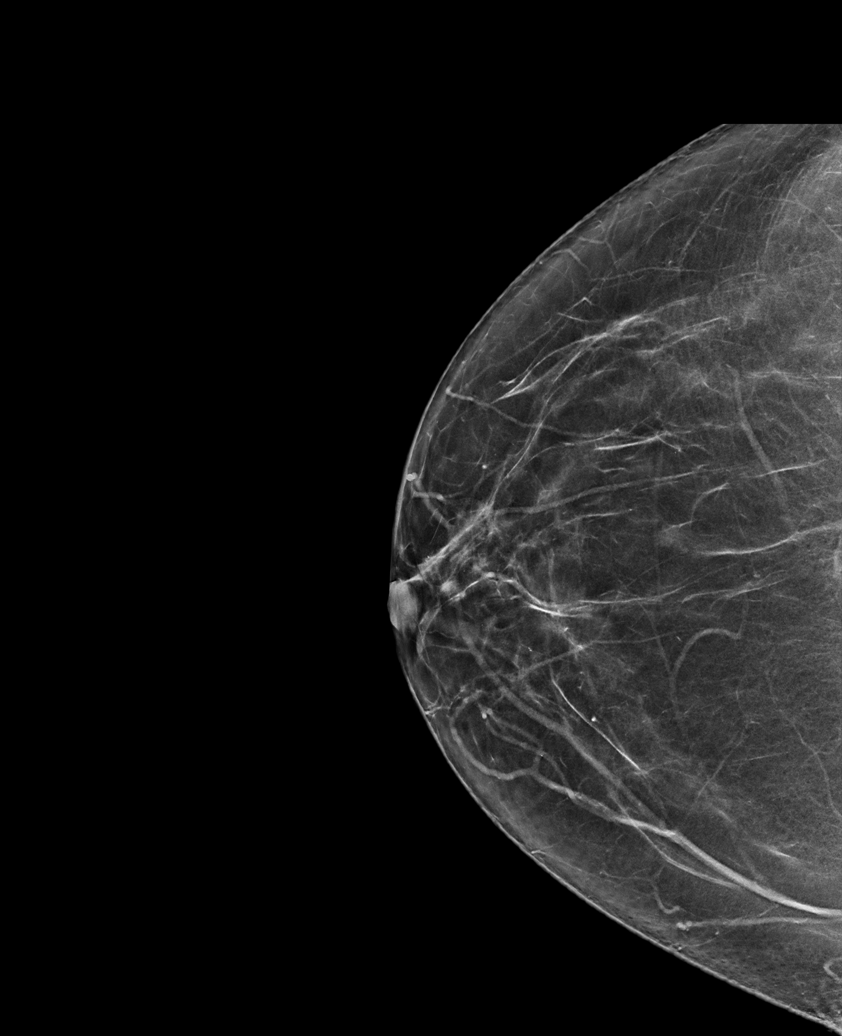

[L CC synth-2D]
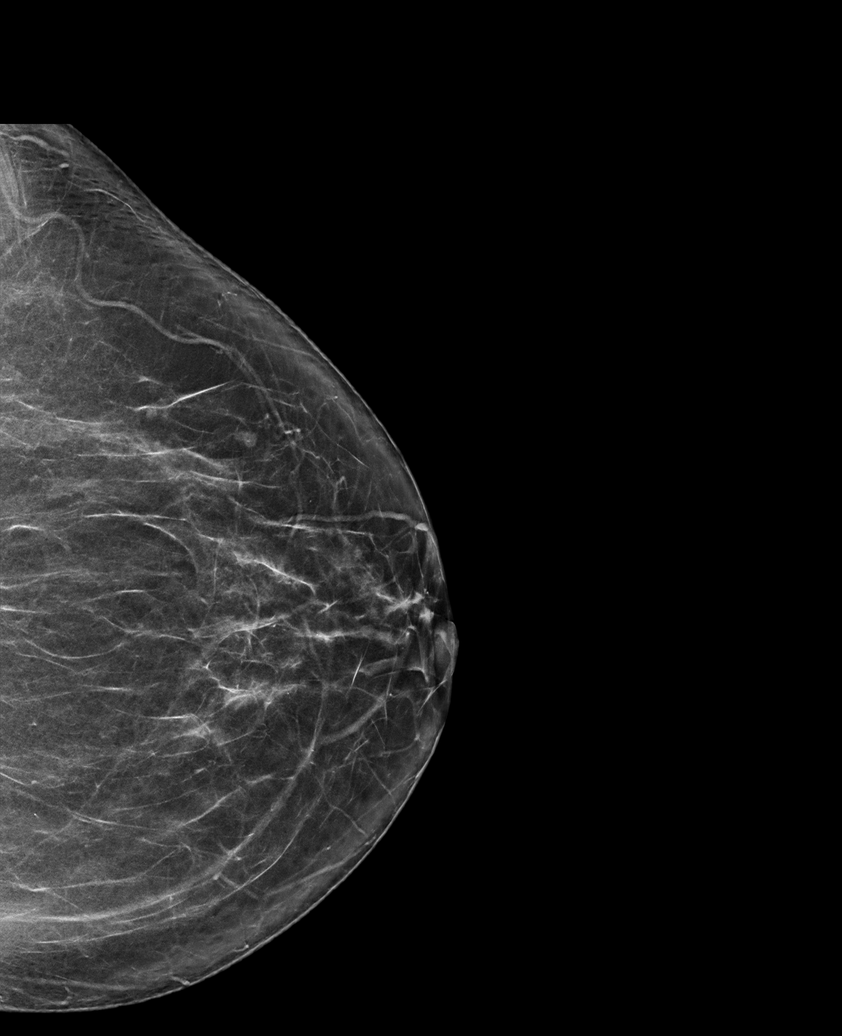

[R MLO synth-2D]
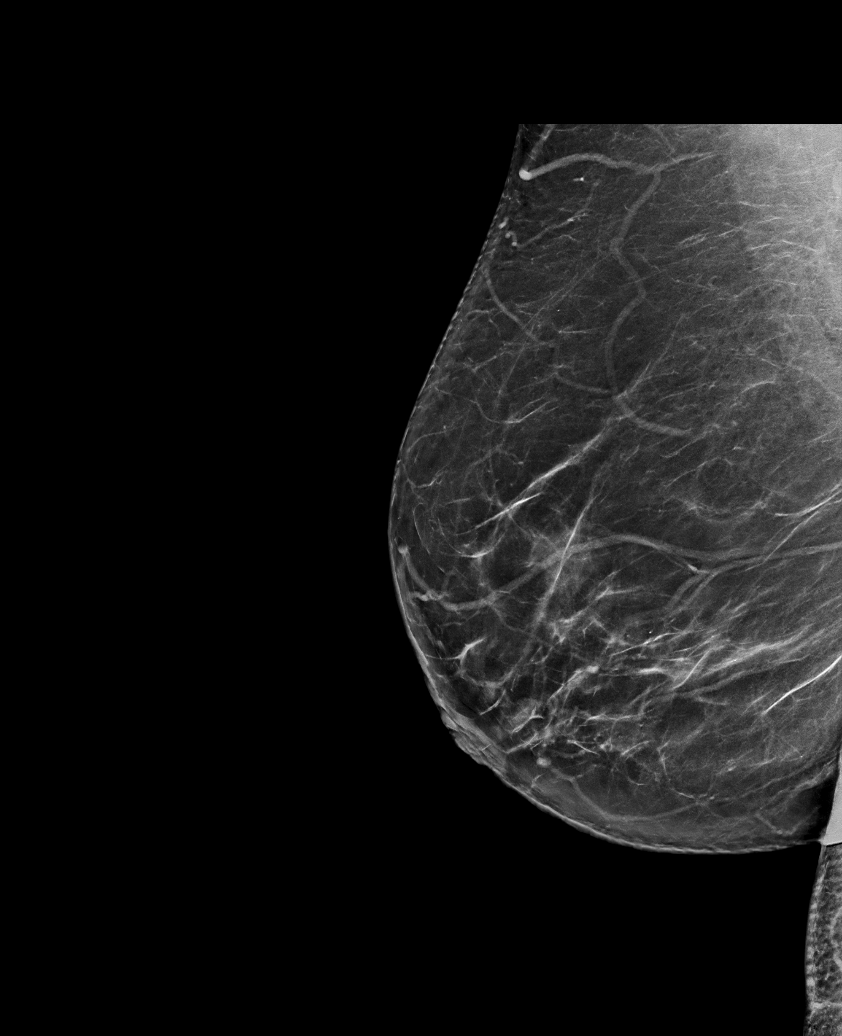

[R CV synth-2D]
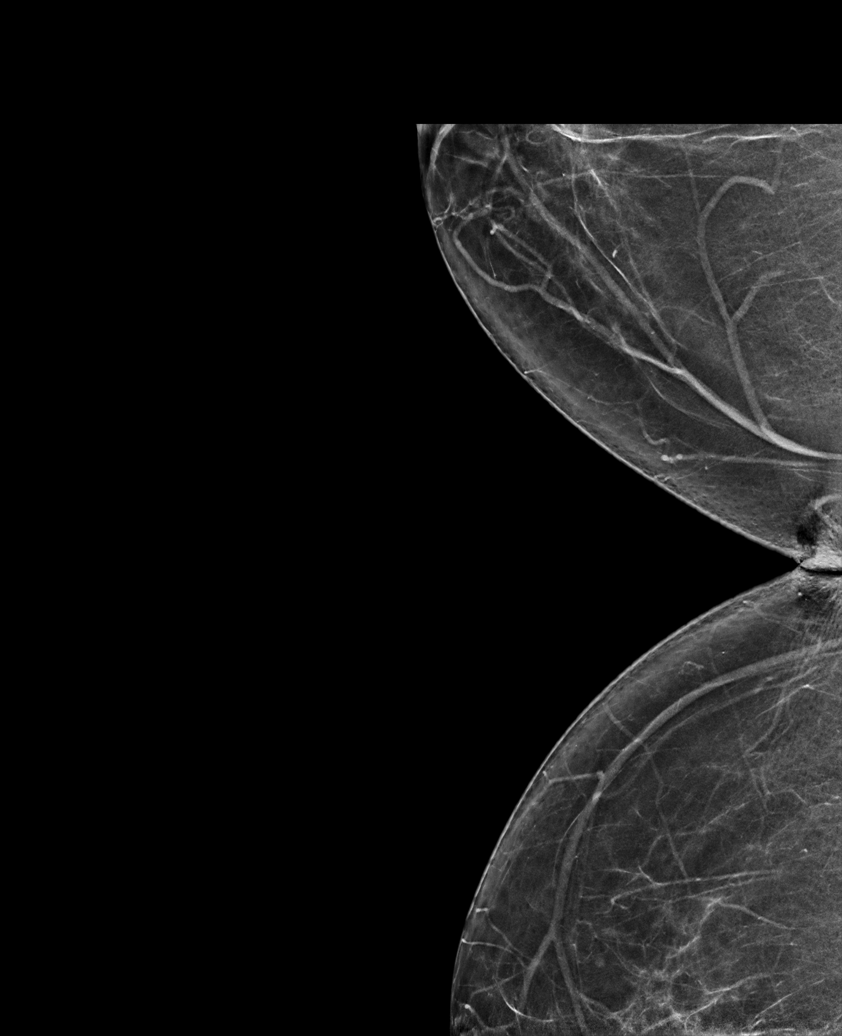

[L MLO synth-2D]
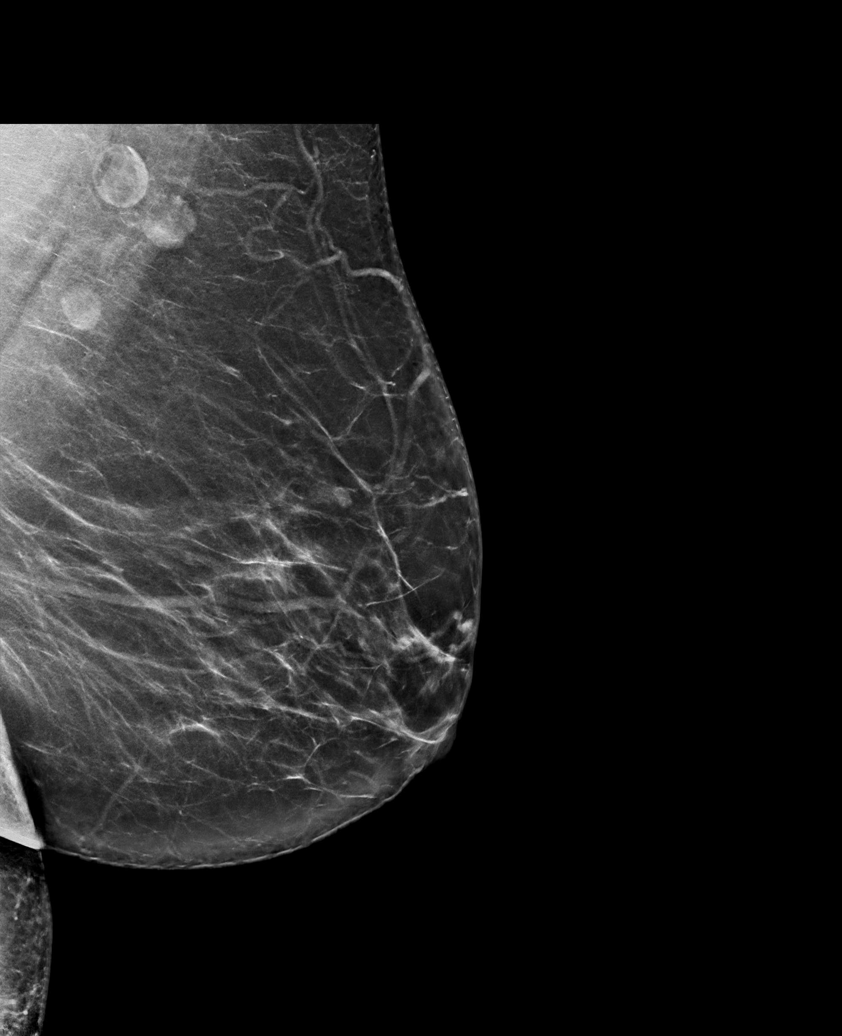

[R CC tomo · tomo slice 34/67.0]
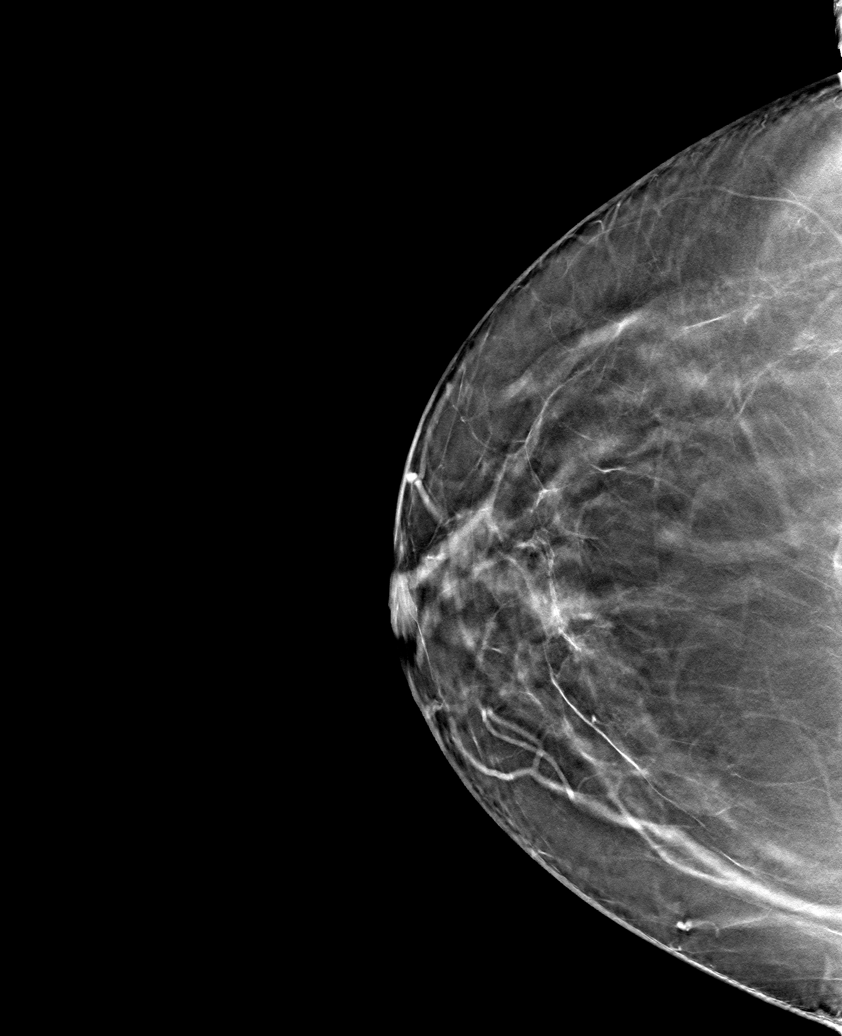

[6 of 30 positions shown; findings below may reference images not displayed]

ACR Breast Density Category b: There are scattered areas of
fibroglandular density.
FINDINGS: There are no findings suspicious for malignancy.
IMPRESSION: No mammographic evidence of malignancy. A result letter of this
screening mammogram will be mailed directly to the patient.

RECOMMENDATION:
Screening mammogram in one year. (Code:51-O-LD2)

BI-RADS CATEGORY  1: Negative.

## 2023-07-22 ENCOUNTER — Encounter: Payer: Self-pay | Admitting: Gastroenterology

## 2023-07-22 ENCOUNTER — Ambulatory Visit (AMBULATORY_SURGERY_CENTER)

## 2023-07-22 ENCOUNTER — Telehealth: Payer: Self-pay | Admitting: Gastroenterology

## 2023-07-22 VITALS — Ht 65.0 in | Wt 183.0 lb

## 2023-07-22 DIAGNOSIS — Z1211 Encounter for screening for malignant neoplasm of colon: Secondary | ICD-10-CM

## 2023-07-22 MED ORDER — NA SULFATE-K SULFATE-MG SULF 17.5-3.13-1.6 GM/177ML PO SOLN
1.0000 | Freq: Once | ORAL | 0 refills | Status: AC
Start: 2023-07-22 — End: 2023-07-22

## 2023-07-22 NOTE — Progress Notes (Signed)

## 2023-07-22 NOTE — Telephone Encounter (Signed)
 Spoke with pt all questions answered

## 2023-07-22 NOTE — Patient Instructions (Signed)
 ORAL DIABETIC MEDICATION INSTRUCTIONS Metformin Glipizide Jardiance Glimepiride Farixga Januvia  Rybelsus, Xigduo, Sitagliptin, Synjardy Tradjenta Actos, Alogliptin Invokana  The day before your procedure: 7/31  Do not take your diabetic pill   The day of your procedure: 8/1  Do not take your diabetic pill   We will check your blood sugar levels during the admission process and again in Recovery before discharging you home  _____________________________________________________________________________  ONCE A WEEK INJECTIONS Ozempic,  Mounjaro , Wegovy, Trulicity, Tanzeum, Byetta, Victoza, Bydureon, & SymlinPen  -DO NOT TAKE 7 days prior to the procedure.  Last dose on or before Thursday 7/24 failure to hold this medication will result in a cancellation or rescheduling of your procedure  ________________________________________________________________________________

## 2023-07-22 NOTE — Telephone Encounter (Signed)
 Patient requesting f/u call to discuss prep. Please advise.

## 2023-08-07 ENCOUNTER — Encounter: Payer: Self-pay | Admitting: Gastroenterology

## 2023-08-07 ENCOUNTER — Ambulatory Visit: Admitting: Gastroenterology

## 2023-08-07 VITALS — BP 124/76 | HR 86 | Temp 98.0°F | Resp 14 | Ht 65.0 in | Wt 183.0 lb

## 2023-08-07 DIAGNOSIS — K640 First degree hemorrhoids: Secondary | ICD-10-CM | POA: Diagnosis not present

## 2023-08-07 DIAGNOSIS — K5730 Diverticulosis of large intestine without perforation or abscess without bleeding: Secondary | ICD-10-CM | POA: Diagnosis not present

## 2023-08-07 DIAGNOSIS — K621 Rectal polyp: Secondary | ICD-10-CM

## 2023-08-07 DIAGNOSIS — Z1211 Encounter for screening for malignant neoplasm of colon: Secondary | ICD-10-CM | POA: Diagnosis present

## 2023-08-07 DIAGNOSIS — D128 Benign neoplasm of rectum: Secondary | ICD-10-CM

## 2023-08-07 MED ORDER — SODIUM CHLORIDE 0.9 % IV SOLN
500.0000 mL | Freq: Once | INTRAVENOUS | Status: DC
Start: 2023-08-07 — End: 2023-08-07

## 2023-08-07 NOTE — Progress Notes (Signed)
 [facility] History and Physical   Primary Care Physician:  Silver Lamar LABOR, MD   Reason for Procedure:    CRC screening- last colonoscopy over 10 years ago with Dr. Towana.  Plan:      Colonoscopy     HPI: Laurie Garcia is a 63 y.o. female    Past Medical History:  Diagnosis Date   AMA (advanced maternal age) multigravida 35+    Arthritis    Diabetes mellitus    GERD (gastroesophageal reflux disease)    Grief reaction 2007   H/O abdominal pain 2007   H/O candidiasis    H/O diarrhea 2011   H/O varicella    H/O: menorrhagia 10/02/2008   History of fatigue 12/18/10   Hx: UTI (urinary tract infection) 2008   Infertility associated with anovulation 2007   Irregular periods/menstrual cycles 2007   Polycystic ovarian disease    Renal cyst 08/09/2010   Stress at home 2007   With husband    Yeast infection    While pregnant    Past Surgical History:  Procedure Laterality Date   ADENOIDECTOMY     BLADDER SURGERY     CYSTOSCOPY/URETEROSCOPY/HOLMIUM LASER/STENT PLACEMENT Left 06/23/2022   Procedure: CYSTOSCOPY/URETEROSCOPY/HOLMIUM LASER/STENT PLACEMENT;  Surgeon: Watt Rush, MD;  Location: [facility];  Service: Urology;  Laterality: Left;   DILATION AND CURETTAGE OF UTERUS     HYSTEROSCOPY WITH D & C  10/02/2008   KIDNEY STONE SURGERY     KNEE SURGERY     pelvic relaxation  2007   percutaneous lithotomy     polypectomy  2010   TONSILLECTOMY     UMBILICAL HERNIA REPAIR      Prior to Admission medications   Medication Sig Start Date End Date Taking? Authorizing Provider  Continuous Glucose Sensor (DEXCOM G7 SENSOR) MISC SMARTSIG:1 Every 10 Days   Yes [provider]  losartan (COZAAR) 25 MG tablet Take 25 mg by mouth daily. 09/15/14  Yes [provider]  metFORMIN (GLUCOPHAGE) 500 MG tablet Take 500 mg by mouth 4 (four) times daily.   Yes [provider]  diclofenac (VOLTAREN) 75 MG EC tablet Take 75 mg by mouth 2 (two) times daily.  05/25/23   [provider]  methocarbamol (ROBAXIN) 500 MG tablet Take 500 mg by mouth 4 (four) times daily as needed. 03/20/23   [provider]  Multiple Vitamin (MULTIVITAMIN) capsule Take 1 capsule by mouth daily.    [provider]  Multiple Vitamins-Minerals (PRESERVISION AREDS PO) Take 1 capsule by mouth daily.    [provider]  ondansetron  (ZOFRAN -ODT) 4 MG disintegrating tablet Take 4 mg by mouth every 8 (eight) hours as needed. 12/26/22   [provider]  tirzepatide  (MOUNJARO ) 12.5 MG/0.5ML Pen Inject 12.5 mg into the skin once a week. 07/02/23   Dean Clarity, MD    Current Outpatient Medications  Medication Sig Dispense Refill   Continuous Glucose Sensor (DEXCOM G7 SENSOR) MISC SMARTSIG:1 Every 10 Days     losartan (COZAAR) 25 MG tablet Take 25 mg by mouth daily.     metFORMIN (GLUCOPHAGE) 500 MG tablet Take 500 mg by mouth 4 (four) times daily.     diclofenac (VOLTAREN) 75 MG EC tablet Take 75 mg by mouth 2 (two) times daily.     methocarbamol (ROBAXIN) 500 MG tablet Take 500 mg by mouth 4 (four) times daily as needed.     Multiple Vitamin (MULTIVITAMIN) capsule Take 1 capsule by mouth daily.  Multiple Vitamins-Minerals (PRESERVISION AREDS PO) Take 1 capsule by mouth daily.     ondansetron  (ZOFRAN -ODT) 4 MG disintegrating tablet Take 4 mg by mouth every 8 (eight) hours as needed.     tirzepatide  (MOUNJARO ) 12.5 MG/0.5ML Pen Inject 12.5 mg into the skin once a week. 6 mL 0   No current facility-administered medications for this visit.    Allergies as of 08/07/2023 - Review Complete 08/07/2023  Allergen Reaction Noted   Oxycodone -acetaminophen  Nausea Only 04/29/2013   Pholcodine Hives 07/22/2023   Tyloxapol Hives 07/22/2023   Codeine Dermatitis, Itching, Nausea Only, and Other (See Comments) 07/24/2010   Latex Itching and Dermatitis 07/24/2010   Oxycodone  Itching and Dermatitis 07/24/2010   Tramadol hcl Dermatitis, Itching,  and Nausea Only 04/29/2013    Family History  Problem Relation Age of Onset   Kidney disease Mother    Breast cancer Mother 22   Diabetes Father    Kidney disease Father    Diabetes Maternal Grandmother    Diabetes Paternal Grandmother    Cancer Paternal Grandmother        Bladder   Cancer Paternal Grandfather        Bladder   Cancer Cousin        Thyroid    Colon cancer Neg Hx    Rectal cancer Neg Hx    Stomach cancer Neg Hx    Esophageal cancer Neg Hx     Social History   Socioeconomic History   Marital status: Married    Spouse name: Not on file   Number of children: Not on file   Years of education: Not on file   Highest education level: Not on file  Occupational History   Not on file  Tobacco Use   Smoking status: Never   Smokeless tobacco: Never  Vaping Use   Vaping status: Never Used  Substance and Sexual Activity   Alcohol use: No   Drug use: No   Sexual activity: Yes    Birth control/protection: Condom  Other Topics Concern   Not on file  Social History Narrative   Not on file   Social Drivers of Health   Financial Resource Strain: Not on file  Food Insecurity: Low Risk  (06/10/2023)   Received from [facility]   Hunger Vital Sign    Within the past 12 months, you worried that your food would run out before you got money to buy more: Never true    Within the past 12 months, the food you bought just didn't last and you didn't have money to get more. : Never true  Transportation Needs: No Transportation Needs (06/10/2023)   Received from [facility]    In the past 12 months, has lack of reliable transportation kept you from medical appointments, meetings, work or from getting things needed for daily living? : No  Physical Activity: Not on file  Stress: Not on file  Social Connections: Not on file  Intimate Partner Violence: Not on file    Review of Systems: Positive for none All other review of systems negative except as  mentioned in the HPI.  Physical Exam: Vital signs in last 24 hours: @VSRANGES @   General:   Alert,  Well-developed, well-nourished, pleasant and cooperative in NAD Lungs:  Clear throughout to auscultation.   Heart:  Regular rate and rhythm; no murmurs, clicks, rubs,  or gallops. Abdomen:  Soft, nontender and nondistended. Normal bowel sounds.   Neuro/Psych:  Alert and cooperative. Normal  mood and affect. A and O x 3    No significant changes were identified.  The patient continues to be an appropriate candidate for the planned procedure and anesthesia.   Anselm Bring, MD. [facility] 08/07/2023 4:34 PM@

## 2023-08-07 NOTE — Progress Notes (Signed)
 Called to room to assist during endoscopic procedure.  Patient ID and intended procedure confirmed with present staff. Received instructions for my participation in the procedure from the performing physician.

## 2023-08-07 NOTE — Progress Notes (Signed)
 Pt's states no medical or surgical changes since previsit or office visit.

## 2023-08-07 NOTE — Progress Notes (Signed)
 1340 BP 158/102, Labetalol given IV, MD update, vss

## 2023-08-07 NOTE — Progress Notes (Signed)
 Report given to PACU, vss

## 2023-08-07 NOTE — Patient Instructions (Signed)
 High fiber diet  Continue present medications  Repeat colonoscopy in 5 years for surveillance  See handouts for hemorrhoids, polyps and diverticulosis  YOU HAD AN ENDOSCOPIC PROCEDURE TODAY AT THE [facility]:   Refer to the procedure report that was given to you for any specific questions about what was found during the examination.  If the procedure report does not answer your questions, please call your gastroenterologist to clarify.  If you requested that your care partner not be given the details of your procedure findings, then the procedure report has been included in a sealed envelope for you to review at your convenience later.  YOU SHOULD EXPECT: Some feelings of bloating in the abdomen. Passage of more gas than usual.  Walking can help get rid of the air that was put into your GI tract during the procedure and reduce the bloating. If you had a lower endoscopy (such as a colonoscopy or flexible sigmoidoscopy) you may notice spotting of blood in your stool or on the toilet paper. If you underwent a bowel prep for your procedure, you may not have a normal bowel movement for a few days.  Please Note:  You might notice some irritation and congestion in your nose or some drainage.  This is from the oxygen used during your procedure.  There is no need for concern and it should clear up in a day or so.  SYMPTOMS TO REPORT IMMEDIATELY: Following lower endoscopy (colonoscopy or flexible sigmoidoscopy):  Excessive amounts of blood in the stool  Significant tenderness or worsening of abdominal pains  Swelling of the abdomen that is new, acute  Fever of 100F or higher  For urgent or emergent issues, a gastroenterologist can be reached at any hour by calling [phone]. Do not use MyChart messaging for urgent concerns.   DIET:  We do recommend a small meal at first, but then you may proceed to your regular diet.  Drink plenty of fluids but you should avoid alcoholic beverages for  24 hours.  ACTIVITY:  You should plan to take it easy for the rest of today and you should NOT DRIVE or use heavy machinery until tomorrow (because of the sedation medicines used during the test).    FOLLOW UP: Our staff will call the number listed on your records the next business day following your procedure.  We will call around 7:15- 8:00 am to check on you and address any questions or concerns that you may have regarding the information given to you following your procedure. If we do not reach you, we will leave a message.     If any biopsies were taken you will be contacted by phone or by letter within the next 1-3 weeks.  Please call us  at [phone] if you have not heard about the biopsies in 3 weeks.   SIGNATURES/CONFIDENTIALITY: You and/or your care partner have signed paperwork which will be entered into your electronic medical record.  These signatures attest to the fact that that the information above on your After Visit Summary has been reviewed and is understood.  Full responsibility of the confidentiality of this discharge information lies with you and/or your care-partner.

## 2023-08-07 NOTE — Op Note (Signed)
 [facility] Patient Name: Laurie Garcia Procedure Date: 08/07/2023 1:16 PM MRN: [id number] Endoscopist: Lynnie Bring , MD, [id number] Age: 63 Referring MD:  Date of Birth: 1960-02-02 Gender: Female Account #: [id number] Procedure:                Colonoscopy Indications:              Screening for colorectal malignant neoplasm. Note                            that she had CT Abdo/pelvis which showed                            diverticulosis. Medicines:                Monitored Anesthesia Care Procedure:                Pre-Anesthesia Assessment:                           - Prior to the procedure, a History and Physical                            was performed, and patient medications and                            allergies were reviewed. The patient's tolerance of                            previous anesthesia was also reviewed. The risks                            and benefits of the procedure and the sedation                            options and risks were discussed with the patient.                            All questions were answered, and informed consent                            was obtained. Prior Anticoagulants: The patient has                            taken no anticoagulant or antiplatelet agents. ASA                            Grade Assessment: II - A patient with mild systemic                            disease. After reviewing the risks and benefits,                            the patient was deemed in satisfactory condition to  undergo the procedure.                           After obtaining informed consent, the colonoscope                            was passed under direct vision. Throughout the                            procedure, the patient's blood pressure, pulse, and                            oxygen saturations were monitored continuously. The                            Olympus Scope CF SN [id number] was introduced through                             the anus and advanced to the 2 cm into the ileum.                            The colonoscopy was performed without difficulty.                            The patient tolerated the procedure well. The                            quality of the bowel preparation was good. The                            terminal ileum, ileocecal valve, appendiceal                            orifice, and rectum were photographed. Scope In: 1:39:09 PM Scope Out: 1:55:17 PM Scope Withdrawal Time: 0 hours 10 minutes 58 seconds  Total Procedure Duration: 0 hours 16 minutes 8 seconds  Findings:                 A 2 mm polyp was found in the proximal rectum. The                            polyp was sessile. The polyp was removed with a                            cold snare. Resection were complete. Not retrieved                            as this broke down into several smal pieces.                           Multiple medium-mouthed diverticula were found in                            the sigmoid colon, descending  colon and few in                            transverse colon.                           Non-bleeding internal hemorrhoids were found during                            retroflexion. The hemorrhoids were small and Grade                            I (internal hemorrhoids that do not prolapse).                           The terminal ileum appeared normal.                           Retroflexion in the right colon was performed.                           The exam was otherwise without abnormality on                            direct and retroflexion views. Complications:            No immediate complications. Estimated Blood Loss:     Estimated blood loss: none. Impression:               - One 2 mm polyp in the proximal rectum, removed                            with a cold snare.                           - Moderate predominantly left colonic                            diverticulosis.                            - Non-bleeding internal hemorrhoids.                           - The examined portion of the ileum was normal.                           - The examination was otherwise normal on direct                            and retroflexion views. Recommendation:           - Patient has a contact number available for                            emergencies. The signs and symptoms of potential  delayed complications were discussed with the                            patient. Return to normal activities tomorrow.                            Written discharge instructions were provided to the                            patient.                           - High fiber diet.                           - Continue present medications.                           - Repeat colonoscopy in 5 years for surveillance.                           - The findings and recommendations were discussed                            with the patient's family. Lynnie Bring, MD 08/07/2023 2:02:23 PM This report has been signed electronically.

## 2023-08-10 ENCOUNTER — Telehealth: Payer: Self-pay

## 2023-08-10 NOTE — Telephone Encounter (Signed)
  Follow up Call-     08/07/2023    1:12 PM  Call back number  Post procedure Call Back phone  # [phone]  Permission to leave phone message Yes     Patient questions:  Do you have a fever, pain , or abdominal swelling? No. Pain Score  0 *  Have you tolerated food without any problems? Yes.    Have you been able to return to your normal activities? Yes.    Do you have any questions about your discharge instructions: Diet   No. Medications  No. Follow up visit  No.  Do you have questions or concerns about your Care? No.  Actions: * If pain score is 4 or above: No action needed, pain <4.

## 2023-12-28 ENCOUNTER — Other Ambulatory Visit: Payer: Self-pay | Admitting: Family Medicine

## 2023-12-28 DIAGNOSIS — Z1231 Encounter for screening mammogram for malignant neoplasm of breast: Secondary | ICD-10-CM

## 2024-01-28 ENCOUNTER — Ambulatory Visit
Admission: RE | Admit: 2024-01-28 | Discharge: 2024-01-28 | Disposition: A | Discharge status: Home / Self Care | Source: Ambulatory Visit | Attending: Family Medicine | Admitting: Family Medicine

## 2024-01-28 DIAGNOSIS — Z1231 Encounter for screening mammogram for malignant neoplasm of breast: Secondary | ICD-10-CM
